# Patient Record
Sex: Male | Born: 1960 | Race: White | Hispanic: No | Marital: Single | Smoking: Former smoker
Health system: Southern US, Community
[De-identification: ages and names within clinical notes are randomized; demographics above are authoritative.]

## PROBLEM LIST (undated history)

## (undated) DIAGNOSIS — F411 Generalized anxiety disorder: Secondary | ICD-10-CM

## (undated) DIAGNOSIS — E785 Hyperlipidemia, unspecified: Secondary | ICD-10-CM

## (undated) DIAGNOSIS — L820 Inflamed seborrheic keratosis: Secondary | ICD-10-CM

## (undated) DIAGNOSIS — K219 Gastro-esophageal reflux disease without esophagitis: Secondary | ICD-10-CM

## (undated) DIAGNOSIS — F329 Major depressive disorder, single episode, unspecified: Secondary | ICD-10-CM

## (undated) DIAGNOSIS — Z86010 Personal history of colonic polyps: Secondary | ICD-10-CM

## (undated) HISTORY — DX: Major depressive disorder, single episode, unspecified: F32.9

## (undated) HISTORY — PX: COCCYX REMOVAL: SHX600

## (undated) HISTORY — PX: COLONOSCOPY: SHX174

## (undated) HISTORY — DX: Gastro-esophageal reflux disease without esophagitis: K21.9

## (undated) HISTORY — PX: POLYPECTOMY: SHX149

## (undated) HISTORY — DX: Personal history of colonic polyps: Z86.010

## (undated) HISTORY — DX: Generalized anxiety disorder: F41.1

## (undated) HISTORY — DX: Inflamed seborrheic keratosis: L82.0

## (undated) HISTORY — DX: Hyperlipidemia, unspecified: E78.5

---

## 2005-06-08 ENCOUNTER — Ambulatory Visit: Payer: Self-pay | Admitting: Internal Medicine

## 2005-06-26 ENCOUNTER — Ambulatory Visit: Payer: Self-pay | Admitting: Internal Medicine

## 2006-08-23 ENCOUNTER — Ambulatory Visit: Payer: Self-pay | Admitting: Internal Medicine

## 2006-08-30 ENCOUNTER — Ambulatory Visit: Payer: Self-pay | Admitting: Internal Medicine

## 2007-08-22 ENCOUNTER — Ambulatory Visit: Payer: Self-pay | Admitting: Internal Medicine

## 2007-08-22 DIAGNOSIS — F3289 Other specified depressive episodes: Secondary | ICD-10-CM

## 2007-08-22 DIAGNOSIS — F329 Major depressive disorder, single episode, unspecified: Secondary | ICD-10-CM

## 2007-08-22 HISTORY — DX: Major depressive disorder, single episode, unspecified: F32.9

## 2007-08-22 HISTORY — DX: Other specified depressive episodes: F32.89

## 2007-08-22 LAB — CONVERTED CEMR LAB
Alkaline Phosphatase: 113 units/L (ref 39–117)
BUN: 14 mg/dL (ref 6–23)
Basophils Relative: 0 % (ref 0.0–1.0)
CO2: 30 meq/L (ref 19–32)
Creatinine, Ser: 1 mg/dL (ref 0.4–1.5)
Eosinophils Relative: 1.2 % (ref 0.0–5.0)
Glucose, Bld: 101 mg/dL — ABNORMAL HIGH (ref 70–99)
HCT: 44.9 % (ref 39.0–52.0)
Hemoglobin: 15.3 g/dL (ref 13.0–17.0)
Monocytes Absolute: 1.1 10*3/uL — ABNORMAL HIGH (ref 0.2–0.7)
Monocytes Relative: 12.7 % — ABNORMAL HIGH (ref 3.0–11.0)
Neutrophils Relative %: 63.4 % (ref 43.0–77.0)
Nitrite: NEGATIVE
Potassium: 4.9 meq/L (ref 3.5–5.1)
Protein, U semiquant: NEGATIVE
RDW: 13.6 % (ref 11.5–14.6)
TSH: 1.99 microintl units/mL (ref 0.35–5.50)
Total Bilirubin: 0.9 mg/dL (ref 0.3–1.2)
Total CHOL/HDL Ratio: 3.5
Total Protein: 7.4 g/dL (ref 6.0–8.3)
Urobilinogen, UA: 0.2
VLDL: 11 mg/dL (ref 0–40)
WBC Urine, dipstick: NEGATIVE
WBC: 8.5 10*3/uL (ref 4.5–10.5)

## 2007-08-29 ENCOUNTER — Ambulatory Visit: Payer: Self-pay | Admitting: Internal Medicine

## 2007-08-29 DIAGNOSIS — F411 Generalized anxiety disorder: Secondary | ICD-10-CM

## 2007-08-29 HISTORY — DX: Generalized anxiety disorder: F41.1

## 2008-03-04 ENCOUNTER — Ambulatory Visit: Payer: Self-pay | Admitting: Internal Medicine

## 2008-03-04 DIAGNOSIS — H6500 Acute serous otitis media, unspecified ear: Secondary | ICD-10-CM | POA: Insufficient documentation

## 2008-12-31 ENCOUNTER — Ambulatory Visit: Payer: Self-pay | Admitting: Internal Medicine

## 2008-12-31 LAB — CONVERTED CEMR LAB
ALT: 25 units/L (ref 0–53)
AST: 34 units/L (ref 0–37)
Albumin: 4.8 g/dL (ref 3.5–5.2)
BUN: 11 mg/dL (ref 6–23)
Basophils Relative: 0.3 % (ref 0.0–3.0)
Blood in Urine, dipstick: NEGATIVE
CO2: 32 meq/L (ref 19–32)
Chloride: 103 meq/L (ref 96–112)
Creatinine, Ser: 1 mg/dL (ref 0.4–1.5)
Direct LDL: 133.3 mg/dL
Eosinophils Relative: 1.7 % (ref 0.0–5.0)
HDL: 63.6 mg/dL (ref >39.0)
Lymphocytes Relative: 26.2 % (ref 12.0–46.0)
Neutrophils Relative %: 58.8 % (ref 43.0–77.0)
Nitrite: NEGATIVE
Protein, U semiquant: NEGATIVE
RBC: 4.88 M/uL (ref 4.22–5.81)
Specific Gravity, Urine: 1.01
Total Bilirubin: 0.9 mg/dL (ref 0.3–1.2)
VLDL: 15 mg/dL (ref 0–40)
WBC Urine, dipstick: NEGATIVE
WBC: 7.4 10*3/uL (ref 4.5–10.5)

## 2009-01-07 ENCOUNTER — Ambulatory Visit: Payer: Self-pay | Admitting: Internal Medicine

## 2009-08-19 ENCOUNTER — Ambulatory Visit: Payer: Self-pay | Admitting: Gastroenterology

## 2009-08-29 ENCOUNTER — Encounter: Payer: Self-pay | Admitting: Gastroenterology

## 2009-08-29 ENCOUNTER — Ambulatory Visit: Payer: Self-pay | Admitting: Gastroenterology

## 2009-08-29 LAB — HM COLONOSCOPY

## 2009-08-31 ENCOUNTER — Encounter: Payer: Self-pay | Admitting: Gastroenterology

## 2010-03-17 ENCOUNTER — Ambulatory Visit: Payer: Self-pay | Admitting: Internal Medicine

## 2010-03-17 LAB — CONVERTED CEMR LAB
AST: 25 units/L (ref 0–37)
Albumin: 4.6 g/dL (ref 3.5–5.2)
Alkaline Phosphatase: 85 units/L (ref 39–117)
Basophils Relative: 0.6 % (ref 0.0–3.0)
Bilirubin, Direct: 0 mg/dL (ref 0.0–0.3)
CO2: 32 meq/L (ref 19–32)
Calcium: 9.8 mg/dL (ref 8.4–10.5)
Eosinophils Relative: 3.1 % (ref 0.0–5.0)
GFR calc non Af Amer: 74.97 mL/min (ref >60)
Hemoglobin: 15.2 g/dL (ref 13.0–17.0)
Ketones, urine, test strip: NEGATIVE
Lymphocytes Relative: 28.6 % (ref 12.0–46.0)
MCHC: 34.2 g/dL (ref 30.0–36.0)
Monocytes Relative: 15.1 % — ABNORMAL HIGH (ref 3.0–12.0)
Neutro Abs: 4.2 10*3/uL (ref 1.4–7.7)
Neutrophils Relative %: 52.6 % (ref 43.0–77.0)
Nitrite: NEGATIVE
Protein, U semiquant: NEGATIVE
RBC: 4.64 M/uL (ref 4.22–5.81)
Sodium: 143 meq/L (ref 135–145)
Total CHOL/HDL Ratio: 3
Total Protein: 7.6 g/dL (ref 6.0–8.3)
Triglycerides: 81 mg/dL (ref 0.0–149.0)
Urobilinogen, UA: 0.2
VLDL: 16.2 mg/dL (ref 0.0–40.0)
WBC: 8 10*3/uL (ref 4.5–10.5)

## 2010-03-24 ENCOUNTER — Ambulatory Visit: Payer: Self-pay | Admitting: Internal Medicine

## 2010-03-24 DIAGNOSIS — Z860100 Personal history of colon polyps, unspecified: Secondary | ICD-10-CM

## 2010-03-24 HISTORY — DX: Personal history of colon polyps, unspecified: Z86.0100

## 2010-03-24 HISTORY — DX: Personal history of colonic polyps: Z86.010

## 2010-04-11 ENCOUNTER — Ambulatory Visit: Payer: Self-pay | Admitting: Internal Medicine

## 2010-04-11 DIAGNOSIS — L820 Inflamed seborrheic keratosis: Secondary | ICD-10-CM

## 2010-04-11 HISTORY — DX: Inflamed seborrheic keratosis: L82.0

## 2010-10-19 ENCOUNTER — Telehealth: Payer: Self-pay | Admitting: Internal Medicine

## 2010-12-19 NOTE — Assessment & Plan Note (Signed)
Summary: ? mole//ccm   Vital Signs:  Patient profile:   50 year old male Weight:      160 pounds Temp:     97.7 degrees F oral BP sitting:   120 / 70  (right arm) Cuff size:   regular  Vitals Entered By: Duard Brady LPN (Apr 11, 2010 8:00 AM)  Procedure Note  Wart Removal: Date of onset: 04/04/2010 Onset of lesion: 1 week Indication: changing lesion Consent signed: no  Procedure # 1: cryotherapy    Region: medial    Location: arm-lower-left    # lesions removed: 1    Technique: cryotherapy  CC: c/o (L) forearm mole change in appearance Is Patient Diabetic? No   CC:  c/o (L) forearm mole change in appearance.  History of Present Illness: 50 year old patient who has had a lesion involving the left forearm  for  years or decades.  For the past week.  This slightly raised plaque has become more erythematous and scaling.  He has multiple benign-appearing nevi.  He has no other similar lesions  Allergies: 1)  ! Paxil  Physical Exam  General:  Well-developed,well-nourished,in no acute distress; alert,appropriate and cooperative throughout examination Skin:  10 mm x 12 mm slightly raised, erythematous, scaly plaque involving the left forearm area.  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 1 two times a day as needed  Patient Instructions: 1)  call if there is  significant redness, pain, or drainage

## 2010-12-19 NOTE — Assessment & Plan Note (Signed)
Summary: CPX/CJR   Vital Signs:  Patient profile:   50 year old male Height:      69 inches Weight:      157 pounds BMI:     23.27 Temp:     98.6 degrees F oral BP sitting:   110 / 70  (left arm) Cuff size:   regular  Vitals Entered By: Duard Brady LPN (Mar 24, 4741 2:40 PM) CC: cpx -doing well Is Patient Diabetic? No   CC:  cpx -doing well.  History of Present Illness: a 50 year old patient who has a history of mild anxiety disorder is seen today for a wellness exam.  He did have a colonoscopy last year.  He is doing  well today without concerns or complaints  Preventive Screening-Counseling & Management  Alcohol-Tobacco     Smoking Status: never  Allergies: 1)  ! Paxil  Past History:  Past Medical History: Depression Anxiety Colonic polyps, hx of  Past Surgical History: Coccyx Removed 1982 Sebacious cyst Removed colonoscopy 2010  Family History: Reviewed history from 01/07/2009 and no changes required. Family Hsitory Headaches Family History Kidney disease Family History Thyroid disease Family History of Neurological disorder father age 52, history of hypertension, kidney stones mother, age 43, status post thyroidectomy.  History peripheral neuropathy one brother, hypertension, kidney stones,and colonic polyps  Social History: Reviewed history from 08/29/2007 and no changes required. Occupation:  Landscape architect Single Former Smoker Alcohol use-no Drug use-no Regular exercise-no Smoking Status:  never  Review of Systems  The patient denies anorexia, fever, weight loss, weight gain, vision loss, decreased hearing, hoarseness, chest pain, syncope, dyspnea on exertion, peripheral edema, prolonged cough, headaches, hemoptysis, abdominal pain, melena, hematochezia, severe indigestion/heartburn, hematuria, incontinence, genital sores, muscle weakness, suspicious skin lesions, transient blindness, difficulty walking, depression, unusual weight change,  abnormal bleeding, enlarged lymph nodes, angioedema, breast masses, and testicular masses.    Physical Exam  General:  Well-developed,well-nourished,in no acute distress; alert,appropriate and cooperative throughout examination Head:  Normocephalic and atraumatic without obvious abnormalities. No apparent alopecia or balding. Eyes:  No corneal or conjunctival inflammation noted. EOMI. Perrla. Funduscopic exam benign, without hemorrhages, exudates or papilledema. Vision grossly normal. Ears:  External ear exam shows no significant lesions or deformities.  Otoscopic examination reveals clear canals, tympanic membranes are intact bilaterally without bulging, retraction, inflammation or discharge. Hearing is grossly normal bilaterally. Nose:  External nasal examination shows no deformity or inflammation. Nasal mucosa are pink and moist without lesions or exudates. Mouth:  Oral mucosa and oropharynx without lesions or exudates.  Teeth in good repair. Neck:  No deformities, masses, or tenderness noted. Chest Wall:  No deformities, masses, tenderness or gynecomastia noted. Breasts:  No masses or gynecomastia noted Lungs:  Normal respiratory effort, chest expands symmetrically. Lungs are clear to auscultation, no crackles or wheezes. Heart:  Normal rate and regular rhythm. S1 and S2 normal without gallop, murmur, click, rub or other extra sounds. Abdomen:  Bowel sounds positive,abdomen soft and non-tender without masses, organomegaly or hernias noted. Rectal:  No external abnormalities noted. Normal sphincter tone. No rectal masses or tenderness. Genitalia:  Testes bilaterally descended without nodularity, tenderness or masses. No scrotal masses or lesions. No penis lesions or urethral discharge. Prostate:  Prostate gland firm and smooth, no enlargement, nodularity, tenderness, mass, asymmetry or induration. Msk:  No deformity or scoliosis noted of thoracic or lumbar spine.   Pulses:  R and L  carotid,radial,femoral,dorsalis pedis and posterior tibial pulses are full and equal bilaterally Extremities:  No  clubbing, cyanosis, edema, or deformity noted with normal full range of motion of all joints.   Neurologic:  No cranial nerve deficits noted. Station and gait are normal. Plantar reflexes are down-going bilaterally. DTRs are symmetrical throughout. Sensory, motor and coordinative functions appear intact. Skin:  Intact without suspicious lesions or rashes Cervical Nodes:  No lymphadenopathy noted Axillary Nodes:  No palpable lymphadenopathy Inguinal Nodes:  No significant adenopathy Psych:  Cognition and judgment appear intact. Alert and cooperative with normal attention span and concentration. No apparent delusions, illusions, hallucinations   Impression & Recommendations:  Problem # 1:  HEALTH MAINTENANCE EXAM (ICD-V70.0)  Complete Medication List: 1)  Adult Aspirin Low Strength 81 Mg Tbdp (Aspirin) 2)  Alprazolam 0.5 Mg Tabs (Alprazolam) .Marland Kitchen.. 1 two times a day as needed  Patient Instructions: 1)  Please schedule a follow-up appointment in 1 year. 2)  It is important that you exercise regularly at least 20 minutes 5 times a week. If you develop chest pain, have severe difficulty breathing, or feel very tired , stop exercising immediately and seek medical attention. Prescriptions: ALPRAZOLAM 0.5 MG  TABS (ALPRAZOLAM) 1 two times a day as needed  #60 x 4   Entered and Authorized by:   Gordy Savers  MD   Signed by:   Gordy Savers  MD on 03/24/2010   Method used:   Print then Give to Patient   RxID:   [id number]

## 2010-12-19 NOTE — Progress Notes (Signed)
Summary: refill alprazolam  Phone Note Refill Request Message from:  Fax from Pharmacy on October 19, 2010 10:36 AM  Refills Requested: Medication #1:  ALPRAZOLAM 0.5 MG  TABS 1 two times a day as needed. [facility] [address] , [place]   Method Requested: Fax to [employer] Initial call taken by: Duard Brady LPN,  October 19, 2010 10:37 AM    Prescriptions: ALPRAZOLAM 0.5 MG  TABS (ALPRAZOLAM) 1 two times a day as needed  #60 x 4   Entered by:   Duard Brady LPN   Authorized by:   Gordy Savers  MD   Signed by:   Duard Brady LPN on 54/07/8118   Method used:   Historical   RxID:   [id number]  faxed back to [facility]. KIK

## 2011-04-06 NOTE — Assessment & Plan Note (Signed)
[facility] OFFICE NOTE   NAME:Derrick Villegas, Derrick Villegas                       MRN:          [id number]  DATE:08/30/2006                            DOB:          Apr 19, 1961    This is a 50 year old gentleman seen today for a wellness exam.  He has a  history of mild anxiety disorder.  He does take alprazolam p.r.n.  He has  had some minor dermatologic treatments and also apparently in 1982 had his  coccyx surgically removed.  He is doing well today except for some stress-  related issues.  He does have some occasional low back pain and insomnia.  He also has noticed some discoloration and deformity of his right second  toe.  This also has caused some discomfort.   FAMILY HISTORY:  Reviewed and unchanged.  Positive for kidney stone disease.  Mother has had a thyroidectomy and has a history of a peripheral neuropathy.   PHYSICAL EXAMINATION:  GENERAL:  Revealed a healthy-appearing male.  VITAL SIGNS:  Blood pressure 120/78.  SKIN:  Examination of skin revealed his right second toe to be distorted and  deviated medially.  At the base of the toe was a subungual area of  discoloration that has been there for years.  HEENT:  Revealed normal pupillary responses.  Conjunctivae clear.  ENT  normal.  NECK:  No adenopathy or bruits.  CHEST:  Clear.  CARDIOVASCULAR:  Normal heart sounds.  No murmurs.  ABDOMEN:  Benign.  EXTERNAL GENITALIA:  Normal.  RECTAL:  Normal prostate.  Heme-negative stool.  EXTREMITIES:  Negative.   IMPRESSION:  1. Normal ___________ clinical exam.  2. Anxiety disorder.  3. Probable toe onychomycosis, rule out subungual melanoma.   DISPOSITION:  Will set her for a dermatologic consult.  Laboratory studies  were reviewed.  These were unremarkable.  Reassess here in one or two years.            ______________________________  Gordy Savers, MD    PFK/MedQ  DD:  08/30/2006  DT:   09/01/2006  Job #:  [id number]

## 2011-05-11 ENCOUNTER — Other Ambulatory Visit (INDEPENDENT_AMBULATORY_CARE_PROVIDER_SITE_OTHER): Payer: BC Managed Care – PPO

## 2011-05-11 DIAGNOSIS — Z0000 Encounter for general adult medical examination without abnormal findings: Secondary | ICD-10-CM

## 2011-05-11 LAB — HEPATIC FUNCTION PANEL
Albumin: 4.5 g/dL (ref 3.5–5.2)
Alkaline Phosphatase: 100 U/L (ref 39–117)
Bilirubin, Direct: 0.1 mg/dL (ref 0.0–0.3)
Total Protein: 7 g/dL (ref 6.0–8.3)

## 2011-05-11 LAB — POCT URINALYSIS DIPSTICK
Ketones, UA: NEGATIVE
Protein, UA: NEGATIVE
Spec Grav, UA: 1.015
Urobilinogen, UA: 0.2
pH, UA: 7

## 2011-05-11 LAB — BASIC METABOLIC PANEL
CO2: 29 mEq/L (ref 19–32)
Calcium: 9.6 mg/dL (ref 8.4–10.5)
Chloride: 103 mEq/L (ref 96–112)
Creatinine, Ser: 0.9 mg/dL (ref 0.4–1.5)
Glucose, Bld: 96 mg/dL (ref 70–99)

## 2011-05-11 LAB — CBC WITH DIFFERENTIAL/PLATELET
Basophils Absolute: 0.1 10*3/uL (ref 0.0–0.1)
Basophils Relative: 0.6 % (ref 0.0–3.0)
Eosinophils Absolute: 0.3 10*3/uL (ref 0.0–0.7)
Hemoglobin: 14.6 g/dL (ref 13.0–17.0)
Lymphocytes Relative: 25.3 % (ref 12.0–46.0)
MCHC: 33.8 g/dL (ref 30.0–36.0)
MCV: 96.3 fl (ref 78.0–100.0)
Monocytes Absolute: 1.4 10*3/uL — ABNORMAL HIGH (ref 0.1–1.0)
Neutro Abs: 5.8 10*3/uL (ref 1.4–7.7)
Neutrophils Relative %: 57.3 % (ref 43.0–77.0)
RBC: 4.5 Mil/uL (ref 4.22–5.81)
RDW: 14.7 % — ABNORMAL HIGH (ref 11.5–14.6)

## 2011-05-11 LAB — LIPID PANEL
HDL: 58.2 mg/dL (ref >39.00)
Triglycerides: 77 mg/dL (ref 0.0–149.0)
VLDL: 15.4 mg/dL (ref 0.0–40.0)

## 2011-05-17 ENCOUNTER — Encounter: Payer: Self-pay | Admitting: Internal Medicine

## 2011-05-18 ENCOUNTER — Encounter: Payer: Self-pay | Admitting: Internal Medicine

## 2011-05-18 ENCOUNTER — Ambulatory Visit (INDEPENDENT_AMBULATORY_CARE_PROVIDER_SITE_OTHER): Payer: BC Managed Care – PPO | Admitting: Internal Medicine

## 2011-05-18 VITALS — BP 110/70 | HR 90 | Temp 98.5°F | Resp 18 | Ht 69.75 in | Wt 156.0 lb

## 2011-05-18 DIAGNOSIS — Z23 Encounter for immunization: Secondary | ICD-10-CM

## 2011-05-18 DIAGNOSIS — Z0000 Encounter for general adult medical examination without abnormal findings: Secondary | ICD-10-CM

## 2011-05-18 MED ORDER — ALPRAZOLAM 0.5 MG PO TABS: 0.5000 mg | ORAL_TABLET | Freq: Two times a day (BID) | ORAL | Status: DC | PRN

## 2011-05-18 NOTE — Patient Instructions (Signed)
It is important that you exercise regularly, at least 20 minutes 3 to 4 times per week.  If you develop chest pain or shortness of breath seek  medical attention.  Call or return to clinic  as needed  Return in one year for follow-up

## 2011-05-18 NOTE — Progress Notes (Signed)
  Subjective:    Patient ID: Derrick Villegas, male    DOB: 1960-12-01, 50 y.o.   MRN: [id number]  HPI  50 year old patient who is seen today for a wellness exam. He enjoys excellent health he has history of mild anxiety disorder and does take alprazolam usually one half tablet to assist with sleep. He does not take this through the day. He did have a colonoscopy 2 years ago at age 50 and colonic polyps were diagnosed. He is scheduled for followup in 3 years. He has no concerns or complaints today laboratory studies were reviewed.   Review of Systems  Constitutional: Negative for fever, chills, activity change, appetite change and fatigue.  HENT: Negative for hearing loss, ear pain, congestion, rhinorrhea, sneezing, mouth sores, trouble swallowing, neck pain, neck stiffness, dental problem, voice change, sinus pressure and tinnitus.   Eyes: Negative for photophobia, pain, redness and visual disturbance.  Respiratory: Negative for apnea, cough, choking, chest tightness, shortness of breath and wheezing.   Cardiovascular: Negative for chest pain, palpitations and leg swelling.  Gastrointestinal: Negative for nausea, vomiting, abdominal pain, diarrhea, constipation, blood in stool, abdominal distention, anal bleeding and rectal pain.  Genitourinary: Negative for dysuria, urgency, frequency, hematuria, flank pain, decreased urine volume, discharge, penile swelling, scrotal swelling, difficulty urinating, genital sores and testicular pain.  Musculoskeletal: Negative for myalgias, back pain, joint swelling, arthralgias and gait problem.  Skin: Negative for color change, rash and wound.  Neurological: Negative for dizziness, tremors, seizures, syncope, facial asymmetry, speech difficulty, weakness, light-headedness, numbness and headaches.  Hematological: Negative for adenopathy. Does not bruise/bleed easily.  Psychiatric/Behavioral: Positive for sleep disturbance. Negative for suicidal ideas, hallucinations,  behavioral problems, confusion, self-injury, dysphoric mood, decreased concentration and agitation. The patient is not nervous/anxious.        Objective:   Physical Exam  Constitutional: He is oriented to person, place, and time. He appears well-developed.  HENT:  Head: Normocephalic.  Right Ear: External ear normal.  Left Ear: External ear normal.  Eyes: Conjunctivae and EOM are normal.  Neck: Normal range of motion.  Cardiovascular: Normal rate and normal heart sounds.   Pulmonary/Chest: Breath sounds normal.  Abdominal: Bowel sounds are normal.  Genitourinary: Rectum normal, prostate normal and penis normal. Guaiac negative stool. No penile tenderness.  Musculoskeletal: Normal range of motion. He exhibits no edema and no tenderness.  Neurological: He is alert and oriented to person, place, and time.  Psychiatric: He has a normal mood and affect. His behavior is normal.          Assessment & Plan:   Annual health examination Mild anxiety disorder with insomnia  More regular exercise regimen discussed and encouraged. Medications refilled;  return here in one year or when necessary

## 2011-12-10 ENCOUNTER — Other Ambulatory Visit: Payer: Self-pay | Admitting: Internal Medicine

## 2012-03-04 ENCOUNTER — Other Ambulatory Visit: Payer: Self-pay | Admitting: Internal Medicine

## 2012-05-02 ENCOUNTER — Telehealth: Payer: Self-pay | Admitting: Internal Medicine

## 2012-05-02 NOTE — Telephone Encounter (Signed)
This will be discussed and ordered at cpx

## 2012-05-02 NOTE — Telephone Encounter (Signed)
Pt is coming in for a cpx on 05/23/12 and is requesting to be screened for Colon cancer, skin cancer and Melanoma. Please advise how to schedule this appt correctly

## 2012-05-16 ENCOUNTER — Other Ambulatory Visit (INDEPENDENT_AMBULATORY_CARE_PROVIDER_SITE_OTHER): Payer: BC Managed Care – PPO

## 2012-05-16 ENCOUNTER — Telehealth: Payer: Self-pay | Admitting: *Deleted

## 2012-05-16 DIAGNOSIS — Z0000 Encounter for general adult medical examination without abnormal findings: Secondary | ICD-10-CM

## 2012-05-16 LAB — CBC WITH DIFFERENTIAL/PLATELET
Basophils Relative: 0.7 % (ref 0.0–3.0)
Eosinophils Relative: 0.9 % (ref 0.0–5.0)
Lymphocytes Relative: 22.4 % (ref 12.0–46.0)
Neutrophils Relative %: 64.2 % (ref 43.0–77.0)
RBC: 4.68 Mil/uL (ref 4.22–5.81)
WBC: 8.8 10*3/uL (ref 4.5–10.5)

## 2012-05-16 LAB — POCT URINALYSIS DIPSTICK
Bilirubin, UA: NEGATIVE
Ketones, UA: NEGATIVE
Leukocytes, UA: NEGATIVE
Protein, UA: NEGATIVE

## 2012-05-16 LAB — HEPATIC FUNCTION PANEL
ALT: 16 U/L (ref 0–53)
AST: 24 U/L (ref 0–37)
Alkaline Phosphatase: 80 U/L (ref 39–117)
Bilirubin, Direct: 0.1 mg/dL (ref 0.0–0.3)
Total Protein: 7.2 g/dL (ref 6.0–8.3)

## 2012-05-16 LAB — PSA: PSA: 0.87 ng/mL (ref 0.10–4.00)

## 2012-05-16 LAB — BASIC METABOLIC PANEL
Calcium: 9.6 mg/dL (ref 8.4–10.5)
Chloride: 103 mEq/L (ref 96–112)
Creatinine, Ser: 1.1 mg/dL (ref 0.4–1.5)
Sodium: 139 mEq/L (ref 135–145)

## 2012-05-16 NOTE — Telephone Encounter (Signed)
Pt is in the lab getting CPX labs ordered and is requesting Colon CA and Skin CA screening labs be ordered.  I spoke with Dr Kirtland Bouchard and he states there is no such labs.  Jamie in lab informed

## 2012-05-21 ENCOUNTER — Encounter: Payer: Self-pay | Admitting: Internal Medicine

## 2012-05-23 ENCOUNTER — Ambulatory Visit (INDEPENDENT_AMBULATORY_CARE_PROVIDER_SITE_OTHER): Payer: BC Managed Care – PPO | Admitting: Internal Medicine

## 2012-05-23 ENCOUNTER — Encounter: Payer: Self-pay | Admitting: Internal Medicine

## 2012-05-23 VITALS — BP 130/82 | HR 82 | Temp 98.0°F | Resp 16 | Ht 70.0 in | Wt 157.0 lb

## 2012-05-23 DIAGNOSIS — Z86010 Personal history of colonic polyps: Secondary | ICD-10-CM

## 2012-05-23 DIAGNOSIS — F411 Generalized anxiety disorder: Secondary | ICD-10-CM

## 2012-05-23 MED ORDER — ALPRAZOLAM 0.5 MG PO TABS: 0.5000 mg | ORAL_TABLET | Freq: Three times a day (TID) | ORAL | Status: DC | PRN

## 2012-05-23 NOTE — Patient Instructions (Signed)
It is important that you exercise regularly, at least 20 minutes 3 to 4 times per week.  If you develop chest pain or shortness of breath seek  medical attention.  Return in one year for follow-up   

## 2012-05-23 NOTE — Progress Notes (Signed)
Subjective:    Patient ID: Derrick Villegas, male    DOB: 1961-06-04, 51 y.o.   MRN: [id number]  HPI  51 year old patient who is seen today for a health maintenance examination. He has a history of anxiety disorder as well as colonic polyps. He had his initial colonoscopy at age 61 and did have colonic polyps diagnosed at that time. He states her brother was recently diagnosed with obstructing colon cancer following a colonoscopy 3 years prior. His mother has been diagnosed and treated for significant postherpetic neuralgia and he is asking about a shingles vaccine.  Past Medical History  Diagnosis Date  . ANXIETY 08/29/2007  . COLONIC POLYPS, HX OF 03/24/2010  . DEPRESSION 08/22/2007  . Inflamed seborrheic keratosis 04/11/2010    History   Social History  . Marital Status: Single    Spouse Name: N/A    Number of Children: N/A  . Years of Education: N/A   Occupational History  . Not on file.   Social History Main Topics  . Smoking status: Former Games developer  . Smokeless tobacco: Never Used  . Alcohol Use: No  . Drug Use: No  . Sexually Active: Not on file   Other Topics Concern  . Not on file   Social History Narrative  . No narrative on file    Past Surgical History  Procedure Date  . Coccyx removal     Family History  Problem Relation Age of Onset  . Thyroid disease Mother   . Hypertension Father   . Kidney disease Father   . Hypertension Brother   . Kidney disease Brother     Allergies  Allergen Reactions  . Paroxetine     REACTION: nausea, tingling in arms    Current Outpatient Prescriptions on File Prior to Visit  Medication Sig Dispense Refill  . ALPRAZolam (XANAX) 0.5 MG tablet take 1 tablet by mouth twice a day if needed  60 tablet  1    BP 130/82  Pulse 82  Temp 98 F (36.7 C) (Oral)  Resp 16  Ht 5\' 10"  (1.778 m)  Wt 157 lb (71.215 kg)  BMI 22.53 kg/m2  SpO2 98%       Review of Systems  Constitutional: Negative for fever, chills, activity  change, appetite change and fatigue.  HENT: Negative for hearing loss, ear pain, congestion, rhinorrhea, sneezing, mouth sores, trouble swallowing, neck pain, neck stiffness, dental problem, voice change, sinus pressure and tinnitus.   Eyes: Negative for photophobia, pain, redness and visual disturbance.  Respiratory: Negative for apnea, cough, choking, chest tightness, shortness of breath and wheezing.   Cardiovascular: Negative for chest pain, palpitations and leg swelling.  Gastrointestinal: Negative for nausea, vomiting, abdominal pain, diarrhea, constipation, blood in stool, abdominal distention, anal bleeding and rectal pain.  Genitourinary: Negative for dysuria, urgency, frequency, hematuria, flank pain, decreased urine volume, discharge, penile swelling, scrotal swelling, difficulty urinating, genital sores and testicular pain.  Musculoskeletal: Negative for myalgias, back pain, joint swelling, arthralgias and gait problem.  Skin: Negative for color change, rash and wound.  Neurological: Negative for dizziness, tremors, seizures, syncope, facial asymmetry, speech difficulty, weakness, light-headedness, numbness and headaches.  Hematological: Negative for adenopathy. Does not bruise/bleed easily.  Psychiatric/Behavioral: Negative for suicidal ideas, hallucinations, behavioral problems, confusion, disturbed wake/sleep cycle, self-injury, dysphoric mood, decreased concentration and agitation. The patient is not nervous/anxious.        Objective:   Physical Exam  Constitutional: He appears well-developed and well-nourished.  HENT:  Head: Normocephalic and  atraumatic.  Right Ear: External ear normal.  Left Ear: External ear normal.  Nose: Nose normal.  Mouth/Throat: Oropharynx is clear and moist.  Eyes: Conjunctivae and EOM are normal. Pupils are equal, round, and reactive to light. No scleral icterus.  Neck: Normal range of motion. Neck supple. No JVD present. No thyromegaly present.    Cardiovascular: Regular rhythm, normal heart sounds and intact distal pulses.  Exam reveals no gallop and no friction rub.   No murmur heard. Pulmonary/Chest: Effort normal and breath sounds normal. He exhibits no tenderness.  Abdominal: Soft. Bowel sounds are normal. He exhibits no distension and no mass. There is no tenderness.  Genitourinary: Prostate normal and penis normal. Guaiac negative stool.  Musculoskeletal: Normal range of motion. He exhibits no edema and no tenderness.  Lymphadenopathy:    He has no cervical adenopathy.  Neurological: He is alert. He has normal reflexes. No cranial nerve deficit. Coordination normal.  Skin: Skin is warm and dry. No rash noted.  Psychiatric: He has a normal mood and affect. His behavior is normal.          Assessment & Plan:   Preventive health examination History of colonic polyps Anxiety disorder Family history: Cancer.  We'll give the patient slides to check for occult blood x4. We'll do this annually until his schedule colonoscopy in 2015

## 2012-05-27 ENCOUNTER — Encounter: Payer: Self-pay | Admitting: Internal Medicine

## 2012-12-08 ENCOUNTER — Other Ambulatory Visit: Payer: Self-pay | Admitting: Internal Medicine

## 2013-02-25 ENCOUNTER — Other Ambulatory Visit: Payer: Self-pay | Admitting: Internal Medicine

## 2013-06-12 ENCOUNTER — Other Ambulatory Visit (INDEPENDENT_AMBULATORY_CARE_PROVIDER_SITE_OTHER): Payer: BC Managed Care – PPO

## 2013-06-12 DIAGNOSIS — Z0000 Encounter for general adult medical examination without abnormal findings: Secondary | ICD-10-CM

## 2013-06-12 DIAGNOSIS — R7989 Other specified abnormal findings of blood chemistry: Secondary | ICD-10-CM

## 2013-06-12 LAB — BASIC METABOLIC PANEL
BUN: 12 mg/dL (ref 6–23)
Calcium: 9.7 mg/dL (ref 8.4–10.5)
Creatinine, Ser: 1 mg/dL (ref 0.4–1.5)
GFR: 82.65 mL/min (ref >60.00)

## 2013-06-12 LAB — POCT URINALYSIS DIPSTICK
Bilirubin, UA: NEGATIVE
Glucose, UA: NEGATIVE
Nitrite, UA: NEGATIVE

## 2013-06-12 LAB — HEPATIC FUNCTION PANEL
ALT: 21 U/L (ref 0–53)
AST: 24 U/L (ref 0–37)
Bilirubin, Direct: 0.1 mg/dL (ref 0.0–0.3)
Total Bilirubin: 0.5 mg/dL (ref 0.3–1.2)

## 2013-06-12 LAB — CBC WITH DIFFERENTIAL/PLATELET
Eosinophils Relative: 4.6 % (ref 0.0–5.0)
Monocytes Absolute: 1.2 10*3/uL — ABNORMAL HIGH (ref 0.1–1.0)
Monocytes Relative: 12.7 % — ABNORMAL HIGH (ref 3.0–12.0)
Neutrophils Relative %: 51.9 % (ref 43.0–77.0)
Platelets: 297 10*3/uL (ref 150.0–400.0)
WBC: 9.2 10*3/uL (ref 4.5–10.5)

## 2013-06-12 LAB — LDL CHOLESTEROL, DIRECT: Direct LDL: 126.8 mg/dL

## 2013-06-12 LAB — LIPID PANEL
HDL: 54.1 mg/dL (ref >39.00)
Triglycerides: 154 mg/dL — ABNORMAL HIGH (ref 0.0–149.0)

## 2013-06-12 LAB — PSA: PSA: 1.01 ng/mL (ref 0.10–4.00)

## 2013-06-19 ENCOUNTER — Encounter: Payer: Self-pay | Admitting: Internal Medicine

## 2013-06-19 ENCOUNTER — Ambulatory Visit (INDEPENDENT_AMBULATORY_CARE_PROVIDER_SITE_OTHER): Payer: BC Managed Care – PPO | Admitting: Internal Medicine

## 2013-06-19 VITALS — BP 140/90 | HR 76 | Temp 98.7°F | Resp 18 | Ht 69.5 in | Wt 162.0 lb

## 2013-06-19 DIAGNOSIS — Z0000 Encounter for general adult medical examination without abnormal findings: Secondary | ICD-10-CM

## 2013-06-19 DIAGNOSIS — Z86010 Personal history of colonic polyps: Secondary | ICD-10-CM

## 2013-06-19 DIAGNOSIS — Z800 Family history of malignant neoplasm of digestive organs: Secondary | ICD-10-CM

## 2013-06-19 MED ORDER — ALPRAZOLAM 0.5 MG PO TABS: ORAL_TABLET | ORAL | Status: DC

## 2013-06-19 NOTE — Progress Notes (Signed)
Patient ID: NIL Derrick Villegas, male   DOB: 1961-04-05, 52 y.o.   MRN: [id number]  Subjective:    Patient ID: Derrick Villegas, male    DOB: 1961/03/02, 52 y.o.   MRN: [id number]  HPI  89 -year-old patient who is seen today for a health maintenance examination. He has a history of anxiety disorder as well as colonic polyps. He had his initial colonoscopy at age 62 and did have colonic polyps diagnosed at that time. He has a brother who was diagnosed with colon cancer in the spring of 2013. He is due for followup colonoscopy next year   His father has died recently due 2 pancreatic cancer at age 22. His mother has been diagnosed and treated for significant postherpetic neuralgia and he is asking about a shingles vaccine.  Past Medical History  Diagnosis Date  . ANXIETY 08/29/2007  . COLONIC POLYPS, HX OF 03/24/2010  . DEPRESSION 08/22/2007  . Inflamed seborrheic keratosis 04/11/2010    History   Social History  . Marital Status: Single    Spouse Name: N/A    Number of Children: N/A  . Years of Education: N/A   Occupational History  . Not on file.   Social History Main Topics  . Smoking status: Former Games developer  . Smokeless tobacco: Never Used  . Alcohol Use: No  . Drug Use: No  . Sexually Active: Not on file   Other Topics Concern  . Not on file   Social History Narrative  . No narrative on file    Past Surgical History  Procedure Laterality Date  . Coccyx removal      Family History  Problem Relation Age of Onset  . Thyroid disease Mother   . Hypertension Father   . Kidney disease Father   . Hypertension Brother   . Kidney disease Brother   . Cancer Brother     Colon cancer    Allergies  Allergen Reactions  . Paroxetine     REACTION: nausea, tingling in arms    No current outpatient prescriptions on file prior to visit.   No current facility-administered medications on file prior to visit.    BP 140/90  Pulse 76  Temp(Src) 98.7 F (37.1 C) (Oral)  Resp 18  Ht  5' 9.5" (1.765 m)  Wt 162 lb (73.483 kg)  BMI 23.59 kg/m2  SpO2 98%       Review of Systems  Constitutional: Negative for fever, chills, activity change, appetite change and fatigue.  HENT: Negative for hearing loss, ear pain, congestion, rhinorrhea, sneezing, mouth sores, trouble swallowing, neck pain, neck stiffness, dental problem, voice change, sinus pressure and tinnitus.   Eyes: Negative for photophobia, pain, redness and visual disturbance.  Respiratory: Negative for apnea, cough, choking, chest tightness, shortness of breath and wheezing.   Cardiovascular: Negative for chest pain, palpitations and leg swelling.  Gastrointestinal: Negative for nausea, vomiting, abdominal pain, diarrhea, constipation, blood in stool, abdominal distention, anal bleeding and rectal pain.  Genitourinary: Negative for dysuria, urgency, frequency, hematuria, flank pain, decreased urine volume, discharge, penile swelling, scrotal swelling, difficulty urinating, genital sores and testicular pain.  Musculoskeletal: Negative for myalgias, back pain, joint swelling, arthralgias and gait problem.  Skin: Negative for color change, rash and wound.  Neurological: Negative for dizziness, tremors, seizures, syncope, facial asymmetry, speech difficulty, weakness, light-headedness, numbness and headaches.  Hematological: Negative for adenopathy. Does not bruise/bleed easily.  Psychiatric/Behavioral: Negative for suicidal ideas, hallucinations, behavioral problems, confusion, sleep disturbance, self-injury, dysphoric mood,  decreased concentration and agitation. The patient is not nervous/anxious.        Objective:   Physical Exam  Constitutional: He appears well-developed and well-nourished.  HENT:  Head: Normocephalic and atraumatic.  Right Ear: External ear normal.  Left Ear: External ear normal.  Nose: Nose normal.  Mouth/Throat: Oropharynx is clear and moist.  Eyes: Conjunctivae and EOM are normal. Pupils  are equal, round, and reactive to light. No scleral icterus.  Neck: Normal range of motion. Neck supple. No JVD present. No thyromegaly present.  Cardiovascular: Regular rhythm, normal heart sounds and intact distal pulses.  Exam reveals no gallop and no friction rub.   No murmur heard. Pulmonary/Chest: Effort normal and breath sounds normal. He exhibits no tenderness.  Abdominal: Soft. Bowel sounds are normal. He exhibits no distension and no mass. There is no tenderness.  Genitourinary: Prostate normal and penis normal. Guaiac negative stool.  Musculoskeletal: Normal range of motion. He exhibits no edema and no tenderness.  Lymphadenopathy:    He has no cervical adenopathy.  Neurological: He is alert. He has normal reflexes. No cranial nerve deficit. Coordination normal.  Skin: Skin is warm and dry. No rash noted.  Psychiatric: He has a normal mood and affect. His behavior is normal.          Assessment & Plan:   Preventive health examination History of colonic polyps Anxiety disorder Family history: Cancer. Followup colonoscopy one year

## 2013-06-19 NOTE — Patient Instructions (Signed)
Colonoscopy 2015    It is important that you exercise regularly, at least 20 minutes 3 to 4 times per week.  If you develop chest pain or shortness of breath seek  medical attention.  Return in one year for follow-up

## 2014-01-13 ENCOUNTER — Other Ambulatory Visit: Payer: Self-pay | Admitting: Internal Medicine

## 2014-02-27 ENCOUNTER — Encounter: Payer: Self-pay | Admitting: Internal Medicine

## 2014-02-27 ENCOUNTER — Ambulatory Visit (INDEPENDENT_AMBULATORY_CARE_PROVIDER_SITE_OTHER): Payer: BC Managed Care – PPO | Admitting: Internal Medicine

## 2014-02-27 VITALS — BP 130/80 | HR 78 | Temp 98.2°F | Resp 20 | Ht 69.5 in | Wt 156.0 lb

## 2014-02-27 DIAGNOSIS — M65849 Other synovitis and tenosynovitis, unspecified hand: Secondary | ICD-10-CM

## 2014-02-27 DIAGNOSIS — M778 Other enthesopathies, not elsewhere classified: Secondary | ICD-10-CM

## 2014-02-27 DIAGNOSIS — M65839 Other synovitis and tenosynovitis, unspecified forearm: Secondary | ICD-10-CM

## 2014-02-27 DIAGNOSIS — M779 Enthesopathy, unspecified: Principal | ICD-10-CM

## 2014-02-27 NOTE — Patient Instructions (Signed)
Please increase the advil to 600mg  (3 tabs) three times a day with meals. Call in a week or so for cortisone shot if it is not better. (sports medicine doctor)

## 2014-02-27 NOTE — Assessment & Plan Note (Signed)
Reassured---this should resolve on its own Immobilize only for comfort Ice Increase ibuprofen If not improving--call for cortisone shot

## 2014-02-27 NOTE — Progress Notes (Signed)
Pre-visit discussion using our clinic review tool. No additional management support is needed unless otherwise documented below in the visit note.  

## 2014-02-27 NOTE — Progress Notes (Signed)
   Subjective:    Patient ID: Derrick Villegas, male    DOB: 08-11-1961, 53 y.o.   MRN: [id number]  HPI Sheard Majestic to be 4 nights ago Woke the next morning with is right hand crumpled up Having hitch when flexing or extending the distal phalanx Seemed to improve after shower  Then difficulty using mouse at work Kept it in brace over the past few days Has been stable  Has been taking advil-- 400mg  bid since yesterday Hasn't tried heat or ice  Current Outpatient Prescriptions on File Prior to Visit  Medication Sig Dispense Refill  . ALPRAZolam (XANAX) 0.5 MG tablet take 1 tablet by mouth three times a day if needed for sleep  60 tablet  3  . aspirin 81 MG tablet Take 81 mg by mouth daily.      . Multiple Vitamin (MULTIVITAMIN) tablet Take 1 tablet by mouth daily.      . Omega-3 Fatty Acids (FISH OIL) 1000 MG CAPS Take 1 capsule by mouth daily.       No current facility-administered medications on file prior to visit.    Allergies  Allergen Reactions  . Paroxetine     REACTION: nausea, tingling in arms    Past Medical History  Diagnosis Date  . ANXIETY 08/29/2007  . COLONIC POLYPS, HX OF 03/24/2010  . DEPRESSION 08/22/2007  . Inflamed seborrheic keratosis 04/11/2010    Past Surgical History  Procedure Laterality Date  . Coccyx removal      Family History  Problem Relation Age of Onset  . Thyroid disease Mother   . Hypertension Father   . Kidney disease Father   . Hypertension Brother   . Kidney disease Brother   . Cancer Brother     Colon cancer    History   Social History  . Marital Status: Single    Spouse Name: N/A    Number of Children: N/A  . Years of Education: N/A   Occupational History  . Not on file.   Social History Main Topics  . Smoking status: Former Research scientist (life sciences)  . Smokeless tobacco: Never Used  . Alcohol Use: No  . Drug Use: No  . Sexual Activity: Not on file   Other Topics Concern  . Not on file   Social History Narrative  . 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 No distress.  Musculoskeletal:  No swelling or tenderness in right wrist or hand Normal ROM all in joint--- thumb DIP does pop when extending but not tender          Assessment & Plan:

## 2014-03-10 ENCOUNTER — Encounter: Payer: Self-pay | Admitting: Family Medicine

## 2014-03-10 ENCOUNTER — Ambulatory Visit (INDEPENDENT_AMBULATORY_CARE_PROVIDER_SITE_OTHER): Payer: BC Managed Care – PPO | Admitting: Family Medicine

## 2014-03-10 ENCOUNTER — Other Ambulatory Visit (INDEPENDENT_AMBULATORY_CARE_PROVIDER_SITE_OTHER): Payer: BC Managed Care – PPO

## 2014-03-10 VITALS — BP 140/90 | HR 73

## 2014-03-10 DIAGNOSIS — M79644 Pain in right finger(s): Secondary | ICD-10-CM

## 2014-03-10 DIAGNOSIS — M79609 Pain in unspecified limb: Secondary | ICD-10-CM

## 2014-03-10 DIAGNOSIS — M65311 Trigger thumb, right thumb: Secondary | ICD-10-CM | POA: Insufficient documentation

## 2014-03-10 DIAGNOSIS — M6530 Trigger finger, unspecified finger: Secondary | ICD-10-CM

## 2014-03-10 MED ORDER — MELOXICAM 15 MG PO TABS: 15.0000 mg | ORAL_TABLET | Freq: Every day | ORAL | Status: DC

## 2014-03-10 NOTE — Patient Instructions (Signed)
Good to see you Ice 10 minutes 2 times a day meloxicam daily for 10 days then as needed Wear brace for 72 hours. Then nightly for 2 weeks Exercises in 3 days Come back in 2 weeks to make sure you are better.

## 2014-03-10 NOTE — Progress Notes (Signed)
[facility] [address] [place], [place] [address] Phone: [phone] Subjective:    I'm seeing this patient by the request  of:  Nyoka Cowden, MD   CC: Right thumb pain  [id number] Derrick Villegas is a 53 y.o. male coming in with complaint of right thumb pain. Patient states that this started approximately 2 weeks ago. Patient remembers waking up and having severe pain of his right thumb that did follow after having a significant pop. Patient states since that time his thumb can get stuck in certain positions. Patient has some pain to palpation and has been strapping himself. Patient has tried some ibuprofen as well as ice he with no significant benefit. Patient denies any swelling of the joint or any numbness or tingling. Patient does use his hands for work on a regular basis. This of the pain is 8/10 in severity.     Past medical history, social, surgical and family history all reviewed in electronic medical record.   Review of Systems: No headache, visual changes, nausea, vomiting, diarrhea, constipation, dizziness, abdominal pain, skin rash, fevers, chills, night sweats, weight loss, swollen lymph nodes, body aches, joint swelling, muscle aches, chest pain, shortness of breath, mood changes.   Objective Blood pressure 140/90, pulse 73, SpO2 97.00%.  General: No apparent distress alert and oriented x3 mood and affect normal, dressed appropriately.  HEENT: Pupils equal, extraocular movements intact  Respiratory: Patient's speak in full sentences and does not appear short of breath  Cardiovascular: No lower extremity edema, non tender, no erythema  Skin: Warm dry intact with no signs of infection or rash on extremities or on axial skeleton.  Abdomen: Soft nontender  Neuro: Cranial nerves II through XII are intact, neurovascularly intact in all extremities with 2+ DTRs and 2+ pulses.  Lymph: No lymphadenopathy of posterior or anterior  cervical chain or axillae bilaterally.  Gait normal with good balance and coordination.  MSK:  Non tender with full range of motion and good stability and symmetric strength and tone of shoulders, elbows,  hip, knee and ankles bilaterally.  Wrist: Right Inspection normal with no visible erythema or swelling. ROM smooth and normal with good flexion and extension and ulnar/radial deviation that is symmetrical with opposite wrist. Palpation is normal over metacarpals, navicular, lunate, and TFCC; tendons without tenderness/ swelling No snuffbox tenderness. No tenderness over Canal of Guyon. Strength 5/5 in all directions without pain. Negative Finkelstein, tinel's and phalens. Negative Watson's test. Patient's thumb though does have triggering of the IP joint. There is a nodule that is palpated that is tender to palpation. Patient does have increasing flexibility of the thumbs bilaterally  MSK US performed of: Right wrist This study was ordered, performed, and interpreted by Charlann Boxer D.O.  Wrist: All extensor compartments visualized and tendons all normal in appearance without fraying, tears, or sheath effusions. No effusion seen. TFCC intact. Scapholunate ligament intact. Carpal tunnel visualized and median nerve area normal, flexor tendons all normal in appearance without fraying, tears, or sheath effusions. Power doppler signal normal. Patient's flexor tendon of the thumb does show that patient has a nodule over the IP joints but does have calcific changes.  IMPRESSION:  Tenosynovitis with nodule noted. Thumb trigger finger.  After verbal consent patient was prepped with alcohol swabs and with a 25-gauge medications needle was injected with 0.5% Marcaine in the amount of 1 cc and 0.5 cc of Kenalog 40 mg/dL. Into tendon sheath with ultrasound guidance.  Tolerated procedure  well.  Post injections instructions given.      Impression and Recommendations:     This case required  medical decision making of moderate complexity.

## 2014-03-10 NOTE — Assessment & Plan Note (Signed)
Injected today. Patient given a brace that he can also wear a night for the next 72 hours nightly for the next 2 weeks. Patient was given home exercises to start in 72 hours as well. We discussed icing and range of motion exercises it can be beneficial. Patient knows that it may take more than one injection to help with his trigger finger. Patient will come back again in 2 weeks for further evaluation.

## 2014-03-25 ENCOUNTER — Encounter: Payer: Self-pay | Admitting: Family Medicine

## 2014-03-25 ENCOUNTER — Ambulatory Visit (INDEPENDENT_AMBULATORY_CARE_PROVIDER_SITE_OTHER): Payer: BC Managed Care – PPO | Admitting: Family Medicine

## 2014-03-25 VITALS — BP 142/84 | HR 58

## 2014-03-25 DIAGNOSIS — M6530 Trigger finger, unspecified finger: Secondary | ICD-10-CM

## 2014-03-25 DIAGNOSIS — M65311 Trigger thumb, right thumb: Secondary | ICD-10-CM

## 2014-03-25 MED ORDER — DICLOFENAC SODIUM 2 % TD SOLN: 2.0000 "application " | Freq: Two times a day (BID) | TRANSDERMAL | Status: DC

## 2014-03-25 NOTE — Patient Instructions (Signed)
Good to see you +/- the brace at this time.  Message as much as you want.  Continue icing Try pennsaid twice daily they will send it to you.  Come back in 10 days ish and will do another injection.

## 2014-03-25 NOTE — Progress Notes (Signed)
  [facility] [place], [place] [address] Phone: [phone] Subjective:     CC: Right thumb pain follow up  Derrick Villegas is a 53 y.o. male coming in for followup of his left thumb pain. Patient was seen previously and given anti-inflammatories as well as corticosteroid injection into the trigger finger. Patient states that he did have mild improvement for about 2 days and then unfortunately the pain started to come back. Patient states it is better than he was before but still he is unable to do his job significantly effectively. Patient states it is still snapping in with a cemented does cause pain. Patient states that the pain at rest though has completely resolved. Patient has been bracing at but has not noticed any significant improvement but denies any new symptoms..     Past medical history, social, surgical and family history all reviewed in electronic medical record.   Review of Systems: No headache, visual changes, nausea, vomiting, diarrhea, constipation, dizziness, abdominal pain, skin rash, fevers, chills, night sweats, weight loss, swollen lymph nodes, body aches, joint swelling, muscle aches, chest pain, shortness of breath, mood changes.   Objective Blood pressure 142/84, pulse 58, SpO2 98.00%.  General: No apparent distress alert and oriented x3 mood and affect normal, dressed appropriately.  HEENT: Pupils equal, extraocular movements intact  Respiratory: Patient's speak in full sentences and does not appear short of breath  Cardiovascular: No lower extremity edema, non tender, no erythema  Skin: Warm dry intact with no signs of infection or rash on extremities or on axial skeleton.  Abdomen: Soft nontender  Neuro: Cranial nerves II through XII are intact, neurovascularly intact in all extremities with 2+ DTRs and 2+ pulses.  Lymph: No lymphadenopathy of posterior or anterior cervical chain or axillae bilaterally.    Gait normal with good balance and coordination.  MSK:  Non tender with full range of motion and good stability and symmetric strength and tone of shoulders, elbows,  hip, knee and ankles bilaterally.  Wrist: Right Inspection normal with no visible erythema or swelling. ROM smooth and normal with good flexion and extension and ulnar/radial deviation that is symmetrical with opposite wrist. Palpation is normal over metacarpals, navicular, lunate, and TFCC; tendons without tenderness/ swelling No snuffbox tenderness. No tenderness over Canal of Guyon. Strength 5/5 in all directions without pain. Negative Finkelstein, tinel's and phalens. Negative Watson's test. Patient's thumb though does have triggering of the IP joint. There is a nodule that is palpated that is tender to palpation but less so than the previous exam. Once again patient does have laxity of these joints bilaterally.    Impression and Recommendations:     This case required medical decision making of moderate complexity.

## 2014-03-25 NOTE — Assessment & Plan Note (Signed)
Discuss different treatment options. Patient would like to quickest release and I discussed with him the quickest relief would likely be surgical intervention which patient declined. I do feel as patient only been 2 weeks out we are little bit early for another steroid injection and we will consider that in about 1-2 weeks' time though. We discussed massage of the nodule as well as icing I could be beneficial. Patient was given a new prescription for topical anti-inflammatory as well to be helpful. Patient will try these interventions and come back in 10-14 days for further evaluation and treatment. At that time is continuing to have pain we may consider another injection.  Spent greater than 25 minutes with patient face-to-face and had greater than 50% of counseling including as described above in assessment and plan.

## 2014-04-05 ENCOUNTER — Other Ambulatory Visit (INDEPENDENT_AMBULATORY_CARE_PROVIDER_SITE_OTHER): Payer: BC Managed Care – PPO

## 2014-04-05 ENCOUNTER — Encounter: Payer: Self-pay | Admitting: Family Medicine

## 2014-04-05 ENCOUNTER — Ambulatory Visit (INDEPENDENT_AMBULATORY_CARE_PROVIDER_SITE_OTHER): Payer: BC Managed Care – PPO | Admitting: Family Medicine

## 2014-04-05 VITALS — BP 126/84 | HR 75 | Ht 70.0 in | Wt 157.0 lb

## 2014-04-05 DIAGNOSIS — M6530 Trigger finger, unspecified finger: Secondary | ICD-10-CM

## 2014-04-05 DIAGNOSIS — M65311 Trigger thumb, right thumb: Secondary | ICD-10-CM

## 2014-04-05 NOTE — Progress Notes (Signed)
  [facility] [place] [place], [place] [address] Phone: [phone] Subjective:     CC: Right thumb pain follow up  [id number] Derrick Villegas is a 53 y.o. male coming in for followup of his left thumb pain. Patient was seen previously and given anti-inflammatories as well as corticosteroid injection into the trigger finger. Last injection was 4 weeks ago. Patient states he is actually doing considerably better. Patient states that he is not having any triggering is not having any pain. Denies any numbness overall. Overall patient has been able to start working as well. Continues to wear the brace at night.     Past medical history, social, surgical and family history all reviewed in electronic medical record.   Review of Systems: No headache, visual changes, nausea, vomiting, diarrhea, constipation, dizziness, abdominal pain, skin rash, fevers, chills, night sweats, weight loss, swollen lymph nodes, body aches, joint swelling, muscle aches, chest pain, shortness of breath, mood changes.   Objective Blood pressure 126/84, pulse 75, height 5\' 10"  (1.778 m), weight 157 lb (71.215 kg), SpO2 99.00%.  General: No apparent distress alert and oriented x3 mood and affect normal, dressed appropriately.  HEENT: Pupils equal, extraocular movements intact  Respiratory: Patient's speak in full sentences and does not appear short of breath  Cardiovascular: No lower extremity edema, non tender, no erythema  Skin: Warm dry intact with no signs of infection or rash on extremities or on axial skeleton.  Abdomen: Soft nontender  Neuro: Cranial nerves II through XII are intact, neurovascularly intact in all extremities with 2+ DTRs and 2+ pulses.  Lymph: No lymphadenopathy of posterior or anterior cervical chain or axillae bilaterally.  Gait normal with good balance and coordination.  MSK:  Non tender with full range of motion and good stability and symmetric strength and  tone of shoulders, elbows,  hip, knee and ankles bilaterally.  Wrist: Right Inspection normal with no visible erythema or swelling. ROM smooth and normal with good flexion and extension and ulnar/radial deviation that is symmetrical with opposite wrist. Palpation is normal over metacarpals, navicular, lunate, and TFCC; tendons without tenderness/ swelling No snuffbox tenderness. No tenderness over Canal of Guyon. Strength 5/5 in all directions without pain. Negative Finkelstein, tinel's and phalens. Negative Watson's test. Patient's thumb has no triggering today. Patient is responding very well with full movement. No pain to palpation. Neurovascularly intact distally.  Limited musculoskeletal ultrasound shows patient does not have any significant fluid in the nodule is still present but significantly smaller than previous. Impression: Improved nodule in the tendon sheath. Significant decrease in hypoechoic changes.    Impression and Recommendations:     This case required medical decision making of moderate complexity.

## 2014-04-05 NOTE — Assessment & Plan Note (Signed)
The patient is doing significantly better than previously. Patient continued to do home exercises as well as manual massage. We'll continue taping for the next 2 weeks and then only as needed. Patient will come back again on an as-needed basis. Patient has any worsening problems he knows to increase the bracing. We did talk about different at home remedies. Patient will come back once again on an as-needed basis.  Spent greater than 25 minutes with patient face-to-face and had greater than 50% of counseling including as described above in assessment and plan.

## 2014-04-05 NOTE — Patient Instructions (Signed)
Good to see you Keep doing what you are doing.  Continue bracing for the next 2 weeks.  Come back when you need.

## 2014-05-25 ENCOUNTER — Ambulatory Visit (INDEPENDENT_AMBULATORY_CARE_PROVIDER_SITE_OTHER): Payer: BC Managed Care – PPO | Admitting: Internal Medicine

## 2014-05-25 ENCOUNTER — Encounter: Payer: Self-pay | Admitting: Internal Medicine

## 2014-05-25 VITALS — BP 140/90 | HR 71 | Temp 98.8°F | Resp 20 | Ht 70.0 in | Wt 164.0 lb

## 2014-05-25 DIAGNOSIS — H6500 Acute serous otitis media, unspecified ear: Secondary | ICD-10-CM

## 2014-05-25 DIAGNOSIS — H6502 Acute serous otitis media, left ear: Secondary | ICD-10-CM

## 2014-05-25 MED ORDER — FLUTICASONE PROPIONATE 50 MCG/ACT NA SUSP
2.0000 | Freq: Every day | NASAL | Status: DC
Start: 2014-05-25 — End: 2015-11-21

## 2014-05-25 NOTE — Progress Notes (Signed)
Pre visit review using our clinic review tool, if applicable. No additional management support is needed unless otherwise documented below in the visit note. 

## 2014-05-25 NOTE — Progress Notes (Signed)
Subjective:    Patient ID: Derrick Villegas, male    DOB: [birth year], 53 y.o.   MRN: [id number]  HPI  28 -year-old patient presents with a several day history of diminished hearing from the left ear.  There's been no pain, drainage, tinnitus.  He states that a very similar problem of 5 or 6 years ago. Denies any significant nasal congestion, or any recent URI,  Past Medical History  Diagnosis Date  . ANXIETY 08/29/2007  . COLONIC POLYPS, HX OF 03/24/2010  . DEPRESSION 08/22/2007  . Inflamed seborrheic keratosis 04/11/2010    History   Social History  . Marital Status: Single    Spouse Name: N/A    Number of Children: N/A  . Years of Education: N/A   Occupational History  . Not on file.   Social History Main Topics  . Smoking status: Former Research scientist (life sciences)  . Smokeless tobacco: Never Used  . Alcohol Use: No  . Drug Use: No  . Sexual Activity: Not on file   Other Topics Concern  . Not on file   Social History Narrative  . No narrative on file    Past Surgical History  Procedure Laterality Date  . Coccyx removal      Family History  Problem Relation Age of Onset  . Thyroid disease Mother   . Hypertension Father   . Kidney disease Father   . Hypertension Brother   . Kidney disease Brother   . Cancer Brother     Colon cancer    Allergies  Allergen Reactions  . Paroxetine     REACTION: nausea, tingling in arms    Current Outpatient Prescriptions on File Prior to Visit  Medication Sig Dispense Refill  . ALPRAZolam (XANAX) 0.5 MG tablet take 1 tablet by mouth three times a day if needed for sleep  60 tablet  3  . aspirin 81 MG tablet Take 81 mg by mouth daily.      . Multiple Vitamin (MULTIVITAMIN) tablet Take 1 tablet by mouth daily.      . Omega-3 Fatty Acids (FISH OIL) 1000 MG CAPS Take 1 capsule by mouth daily.       No current facility-administered medications on file prior to visit.    BP 140/90  Pulse 71  Temp(Src) 98.8 F (37.1 C) (Oral)  Resp 20  Ht 5'  10" (1.778 m)  Wt 164 lb (74.39 kg)  BMI 23.53 kg/m2  SpO2 97%       Review of Systems  Constitutional: Negative for fever, chills, appetite change and fatigue.  HENT: Positive for hearing loss. Negative for congestion, dental problem, ear pain, sore throat, tinnitus, trouble swallowing and voice change.   Eyes: Negative for pain, discharge and visual disturbance.  Respiratory: Negative for cough, chest tightness, wheezing and stridor.   Cardiovascular: Negative for chest pain, palpitations and leg swelling.  Gastrointestinal: Negative for nausea, vomiting, abdominal pain, diarrhea, constipation, blood in stool and abdominal distention.  Genitourinary: Negative for urgency, hematuria, flank pain, discharge, difficulty urinating and genital sores.  Musculoskeletal: Negative for arthralgias, back pain, gait problem, joint swelling, myalgias and neck stiffness.  Skin: Negative for rash.  Neurological: Negative for dizziness, syncope, speech difficulty, weakness, numbness and headaches.  Hematological: Negative for adenopathy. Does not bruise/bleed easily.  Psychiatric/Behavioral: Negative for behavioral problems and dysphoric mood. The patient is not nervous/anxious.        Objective:   Physical Exam  Constitutional: He is oriented to person, place, and time.  He appears well-developed.  HENT:  Head: Normocephalic.  Right Ear: External ear normal.  Left Ear: External ear normal.  The left tympanic membrane was slightly dull Weber lateralized to the left  Hearing diminished left ear  Eyes: Conjunctivae and EOM are normal.  Neck: Normal range of motion.  Cardiovascular: Normal rate and normal heart sounds.   Pulmonary/Chest: Breath sounds normal.  Abdominal: Bowel sounds are normal.  Musculoskeletal: Normal range of motion. He exhibits no edema and no tenderness.  Neurological: He is alert and oriented to person, place, and time.  Psychiatric: He has a normal mood and affect.  His behavior is normal.          Assessment & Plan:   Serous otitis media.  Will treat with nasal steroids, decongestants, and observe CPX as scheduled

## 2014-05-25 NOTE — Patient Instructions (Addendum)
Use saline irrigation, warm  moist compresses and over-the-counter decongestants only as directed.  Call if there is no improvement in 5 to 7 days, or sooner if you develop increasing pain, fever, or any new symptoms.  Fluticasone nasal spray daily

## 2014-06-29 ENCOUNTER — Other Ambulatory Visit (INDEPENDENT_AMBULATORY_CARE_PROVIDER_SITE_OTHER): Payer: BC Managed Care – PPO

## 2014-06-29 DIAGNOSIS — Z0000 Encounter for general adult medical examination without abnormal findings: Secondary | ICD-10-CM

## 2014-06-29 LAB — CBC WITH DIFFERENTIAL/PLATELET
BASOS ABS: 0 10*3/uL (ref 0.0–0.1)
BASOS PCT: 0.6 % (ref 0.0–3.0)
Eosinophils Absolute: 0.3 10*3/uL (ref 0.0–0.7)
Eosinophils Relative: 3.1 % (ref 0.0–5.0)
HEMATOCRIT: 46.5 % (ref 39.0–52.0)
HEMOGLOBIN: 15.4 g/dL (ref 13.0–17.0)
LYMPHS ABS: 2.6 10*3/uL (ref 0.7–4.0)
Lymphocytes Relative: 30.7 % (ref 12.0–46.0)
MCHC: 33 g/dL (ref 30.0–36.0)
MCV: 95.4 fl (ref 78.0–100.0)
Monocytes Absolute: 1.2 10*3/uL — ABNORMAL HIGH (ref 0.1–1.0)
Monocytes Relative: 14.4 % — ABNORMAL HIGH (ref 3.0–12.0)
NEUTROS ABS: 4.3 10*3/uL (ref 1.4–7.7)
Neutrophils Relative %: 51.2 % (ref 43.0–77.0)
Platelets: 291 10*3/uL (ref 150.0–400.0)
RBC: 4.87 Mil/uL (ref 4.22–5.81)
RDW: 14.3 % (ref 11.5–15.5)
WBC: 8.5 10*3/uL (ref 4.0–10.5)

## 2014-06-29 LAB — POCT URINALYSIS DIPSTICK
Bilirubin, UA: NEGATIVE
GLUCOSE UA: NEGATIVE
Ketones, UA: NEGATIVE
LEUKOCYTES UA: NEGATIVE
NITRITE UA: NEGATIVE
Spec Grav, UA: 1.02
Urobilinogen, UA: 0.2
pH, UA: 6.5

## 2014-06-29 LAB — BASIC METABOLIC PANEL
BUN: 15 mg/dL (ref 6–23)
CHLORIDE: 103 meq/L (ref 96–112)
CO2: 27 meq/L (ref 19–32)
Calcium: 9.7 mg/dL (ref 8.4–10.5)
Creatinine, Ser: 1.2 mg/dL (ref 0.4–1.5)
GFR: 70.06 mL/min (ref >60.00)
GLUCOSE: 93 mg/dL (ref 70–99)
Potassium: 4.7 mEq/L (ref 3.5–5.1)
SODIUM: 140 meq/L (ref 135–145)

## 2014-06-29 LAB — LIPID PANEL
Cholesterol: 247 mg/dL — ABNORMAL HIGH (ref 0–200)
HDL: 61.5 mg/dL (ref >39.00)
LDL Cholesterol: 165 mg/dL — ABNORMAL HIGH (ref 0–99)
NONHDL: 185.5
Total CHOL/HDL Ratio: 4
Triglycerides: 103 mg/dL (ref 0.0–149.0)
VLDL: 20.6 mg/dL (ref 0.0–40.0)

## 2014-06-29 LAB — HEPATIC FUNCTION PANEL
ALBUMIN: 4.6 g/dL (ref 3.5–5.2)
ALT: 21 U/L (ref 0–53)
AST: 26 U/L (ref 0–37)
Alkaline Phosphatase: 83 U/L (ref 39–117)
Bilirubin, Direct: 0.1 mg/dL (ref 0.0–0.3)
Total Bilirubin: 0.7 mg/dL (ref 0.2–1.2)
Total Protein: 7.5 g/dL (ref 6.0–8.3)

## 2014-06-29 LAB — PSA: PSA: 0.96 ng/mL (ref 0.10–4.00)

## 2014-06-29 LAB — TSH: TSH: 2.88 u[IU]/mL (ref 0.35–4.50)

## 2014-07-06 ENCOUNTER — Ambulatory Visit (INDEPENDENT_AMBULATORY_CARE_PROVIDER_SITE_OTHER): Payer: BC Managed Care – PPO | Admitting: Internal Medicine

## 2014-07-06 ENCOUNTER — Encounter: Payer: Self-pay | Admitting: Internal Medicine

## 2014-07-06 VITALS — BP 138/94 | HR 75 | Temp 98.0°F | Resp 20 | Ht 69.5 in | Wt 161.0 lb

## 2014-07-06 DIAGNOSIS — Z86010 Personal history of colonic polyps: Secondary | ICD-10-CM

## 2014-07-06 DIAGNOSIS — Z800 Family history of malignant neoplasm of digestive organs: Secondary | ICD-10-CM

## 2014-07-06 DIAGNOSIS — Z0000 Encounter for general adult medical examination without abnormal findings: Secondary | ICD-10-CM

## 2014-07-06 MED ORDER — ALPRAZOLAM 0.5 MG PO TABS: ORAL_TABLET | ORAL | Status: DC

## 2014-07-06 NOTE — Patient Instructions (Addendum)
It is important that you exercise regularly, at least 20 minutes 3 to 4 times per week.  If you develop chest pain or shortness of breath seek  medical attention.  Return in one year for follow-up  Health Maintenance A healthy lifestyle and preventative care can promote health and wellness.  Maintain regular health, dental, and eye exams.  Eat a healthy diet. Foods like vegetables, fruits, whole grains, low-fat dairy products, and lean protein foods contain the nutrients you need and are low in calories. Decrease your intake of foods high in solid fats, added sugars, and salt. Get information about a proper diet from your health care provider, if necessary.  Regular physical exercise is one of the most important things you can do for your health. Most adults should get at least 150 minutes of moderate-intensity exercise (any activity that increases your heart rate and causes you to sweat) each week. In addition, most adults need muscle-strengthening exercises on 2 or more days a week.   Maintain a healthy weight. The body mass index (BMI) is a screening tool to identify possible weight problems. It provides an estimate of body fat based on height and weight. Your health care provider can find your BMI and can help you achieve or maintain a healthy weight. For males 20 years and older:  A BMI below 18.5 is considered underweight.  A BMI of 18.5 to 24.9 is normal.  A BMI of 25 to 29.9 is considered overweight.  A BMI of 30 and above is considered obese.  Maintain normal blood lipids and cholesterol by exercising and minimizing your intake of saturated fat. Eat a balanced diet with plenty of fruits and vegetables. Blood tests for lipids and cholesterol should begin at age 20 and be repeated every 5 years. If your lipid or cholesterol levels are high, you are over age 50, or you are at high risk for heart disease, you may need your cholesterol levels checked more frequently.Ongoing high lipid  and cholesterol levels should be treated with medicines if diet and exercise are not working.  If you smoke, find out from your health care provider how to quit. If you do not use tobacco, do not start.  Lung cancer screening is recommended for adults aged 55-80 years who are at high risk for developing lung cancer because of a history of smoking. A yearly low-dose CT scan of the lungs is recommended for people who have at least a 30-pack-year history of smoking and are current smokers or have quit within the past 15 years. A pack year of smoking is smoking an average of 1 pack of cigarettes a day for 1 year (for example, a 30-pack-year history of smoking could mean smoking 1 pack a day for 30 years or 2 packs a day for 15 years). Yearly screening should continue until the smoker has stopped smoking for at least 15 years. Yearly screening should be stopped for people who develop a health problem that would prevent them from having lung cancer treatment.  If you choose to drink alcohol, do not have more than 2 drinks per day. One drink is considered to be 12 oz (360 mL) of beer, 5 oz (150 mL) of wine, or 1.5 oz (45 mL) of liquor.  Avoid the use of street drugs. Do not share needles with anyone. Ask for help if you need support or instructions about stopping the use of drugs.  High blood pressure causes heart disease and increases the risk of stroke. Blood   should be checked at least every 1-2 years. Ongoing high blood pressure should be treated with medicines if weight loss and exercise are not effective.  If you are 45-79 years old, ask your health care provider if you should take aspirin to prevent heart disease.  Diabetes screening involves taking a blood sample to check your fasting blood sugar level. This should be done once every 3 years after age 45 if you are at a normal weight and without risk factors for diabetes. Testing should be considered at a younger age or be carried out more  frequently if you are overweight and have at least 1 risk factor for diabetes.  Colorectal cancer can be detected and often prevented. Most routine colorectal cancer screening begins at the age of 50 and continues through age 75. However, your health care provider may recommend screening at an earlier age if you have risk factors for colon cancer. On a yearly basis, your health care provider may provide home test kits to check for hidden blood in the stool. A small camera at the end of a tube may be used to directly examine the colon (sigmoidoscopy or colonoscopy) to detect the earliest forms of colorectal cancer. Talk to your health care provider about this at age 50 when routine screening begins. A direct exam of the colon should be repeated every 5-10 years through age 75, unless early forms of precancerous polyps or small growths are found.  People who are at an increased risk for hepatitis B should be screened for this virus. You are considered at high risk for hepatitis B if:  You were born in a country where hepatitis B occurs often. Talk with your health care provider about which countries are considered high risk.  Your parents were born in a high-risk country and you have not received a shot to protect against hepatitis B (hepatitis B vaccine).  You have HIV or AIDS.  You use needles to inject street drugs.  You live with, or have sex with, someone who has hepatitis B.  You are a man who has sex with other men (MSM).  You get hemodialysis treatment.  You take certain medicines for conditions like cancer, organ transplantation, and autoimmune conditions.  Hepatitis C blood testing is recommended for all people born from 1945 through 1965 and any individual with known risk factors for hepatitis C.  Healthy men should no longer receive prostate-specific antigen (PSA) blood tests as part of routine cancer screening. Talk to your health care provider about prostate cancer  screening.  Testicular cancer screening is not recommended for adolescents or adult males who have no symptoms. Screening includes self-exam, a health care provider exam, and other screening tests. Consult with your health care provider about any symptoms you have or any concerns you have about testicular cancer.  Practice safe sex. Use condoms and avoid high-risk sexual practices to reduce the spread of sexually transmitted infections (STIs).  You should be screened for STIs, including gonorrhea and chlamydia if:  You are sexually active and are younger than 24 years.  You are older than 24 years, and your health care provider tells you that you are at risk for this type of infection.  Your sexual activity has changed since you were last screened, and you are at an increased risk for chlamydia or gonorrhea. Ask your health care provider if you are at risk.  If you are at risk of being infected with HIV, it is recommended that you take a   prescription medicine daily to prevent HIV infection. This is called pre-exposure prophylaxis (PrEP). You are considered at risk if:  You are a man who has sex with other men (MSM).  You are a heterosexual man who is sexually active with multiple partners.  You take drugs by injection.  You are sexually active with a partner who has HIV.  Talk with your health care provider about whether you are at high risk of being infected with HIV. If you choose to begin PrEP, you should first be tested for HIV. You should then be tested every 3 months for as long as you are taking PrEP.  Use sunscreen. Apply sunscreen liberally and repeatedly throughout the day. You should seek shade when your shadow is shorter than you. Protect yourself by wearing long sleeves, pants, a wide-brimmed hat, and sunglasses year round whenever you are outdoors.  Tell your health care provider of new moles or changes in moles, especially if there is a change in shape or color. Also, tell  your health care provider if a mole is larger than the size of a pencil eraser.  A one-time screening for abdominal aortic aneurysm (AAA) and surgical repair of large AAAs by ultrasound is recommended for men aged 68-75 years who are current or former smokers.  Stay current with your vaccines (immunizations). Document Released: 05/03/2008 Document Revised: 11/10/2013 Document Reviewed: 04/02/2011 [facility] Patient Information 2015 [place], [place]. This information is not intended to replace advice given to you by your health care provider. Make sure you discuss any questions you have with your health care provider.  Schedule your colonoscopy to help detect colon cancer.Cardiac Diet This diet can help prevent heart disease and stroke. Many factors influence your heart health, including eating and exercise habits. Coronary risk rises a lot with abnormal blood fat (lipid) levels. Cardiac meal planning includes limiting unhealthy fats, increasing healthy fats, and making other small dietary changes. General guidelines are as follows:  Adjust calorie intake to reach and maintain desirable body weight.  Limit total fat intake to less than 30% of total calories. Saturated fat should be less than 7% of calories.  Saturated fats are found in animal products and in some vegetable products. Saturated vegetable fats are found in coconut oil, cocoa butter, palm oil, and palm kernel oil. Read labels carefully to avoid these products as much as possible. Use butter in moderation. Choose tub margarines and oils that have 2 grams of fat or less. Good cooking oils are canola and olive oils.  Practice low-fat cooking techniques. Do not fry food. Instead, broil, bake, boil, steam, grill, roast on a rack, stir-fry, or microwave it. Other fat reducing suggestions include:  Remove the skin from poultry.  Remove all visible fat from meats.  Skim the fat off stews, soups, and gravies before serving them.  Steam  vegetables in water or broth instead of sauting them in fat.  Avoid foods with trans fat (or hydrogenated oils), such as commercially fried foods and commercially baked goods. Commercial shortening and deep-frying fats will contain trans fat.  Increase intake of fruits, vegetables, whole grains, and legumes to replace foods high in fat.  Increase consumption of nuts, legumes, and seeds to at least 4 servings weekly. One serving of a legume equals  cup, and 1 serving of nuts or seeds equals  cup.  Choose whole grains more often. Have 3 servings per day (a serving is 1 ounce [oz]).  Eat 4 to 5 servings of vegetables per day.  A serving of vegetables is 1 cup of raw leafy vegetables;  cup of raw or cooked cut-up vegetables;  cup of vegetable juice.  Eat 4 to 5 servings of fruit per day. A serving of fruit is 1 medium whole fruit;  cup of dried fruit;  cup of fresh, frozen, or canned fruit;  cup of 100% fruit juice.  Increase your intake of dietary fiber to 20 to 30 grams per day. Insoluble fiber may help lower your risk of heart disease and may help curb your appetite.  Soluble fiber binds cholesterol to be removed from the blood. Foods high in soluble fiber are dried beans, citrus fruits, oats, apples, bananas, broccoli, Brussels sprouts, and eggplant.  Try to include foods fortified with plant sterols or stanols, such as yogurt, breads, juices, or margarines. Choose several fortified foods to achieve a daily intake of 2 to 3 grams of plant sterols or stanols.  Foods with omega-3 fats can help reduce your risk of heart disease. Aim to have a 3.5 oz portion of fatty fish twice per week, such as salmon, mackerel, albacore tuna, sardines, lake trout, or herring. If you wish to take a fish oil supplement, choose one that contains 1 gram of both DHA and EPA.  Limit processed meats to 2 servings (3 oz portion) weekly.  Limit the sodium in your diet to 1500 milligrams (mg) per day. If you have  high blood pressure, talk to a registered dietitian about a DASH (Dietary Approaches to Stop Hypertension) eating plan.  Limit sweets and beverages with added sugar, such as soda, to no more than 5 servings per week. One serving is:   1 tablespoon sugar.  1 tablespoon jelly or jam.   cup sorbet.  1 cup lemonade.   cup regular soda. CHOOSING FOODS Starches  Allowed: Breads: All kinds (wheat, rye, raisin, white, oatmeal, [place], [place], and English muffin bread). Low-fat rolls: English muffins, frankfurter and hamburger buns, bagels, pita bread, tortillas (not fried). Pancakes, waffles, biscuits, and muffins made with recommended oil.  Avoid: Products made with saturated or trans fats, oils, or whole milk products. Butter rolls, cheese breads, croissants. Commercial doughnuts, muffins, sweet rolls, biscuits, waffles, pancakes, store-bought mixes. Crackers  Allowed: Low-fat crackers and snacks: Animal, graham, rye, saltine (with recommended oil, no lard), oyster, and matzo crackers. Bread sticks, melba toast, rusks, flatbread, pretzels, and light popcorn.  Avoid: High-fat crackers: cheese crackers, butter crackers, and those made with coconut, palm oil, or trans fat (hydrogenated oils). Buttered popcorn. Cereals  Allowed: Hot or cold whole-grain cereals.  Avoid: Cereals containing coconut, hydrogenated vegetable fat, or animal fat. Potatoes / Pasta / Rice  Allowed: All kinds of potatoes, rice, and pasta (such as macaroni, spaghetti, and noodles).  Avoid: Pasta or rice prepared with cream sauce or high-fat cheese. Chow mein noodles, [place] fries. Vegetables  Allowed: All vegetables and vegetable juices.  Avoid: Fried vegetables. Vegetables in cream, butter, or high-fat cheese sauces. Limit coconut. Fruit in cream or custard. Protein  Allowed: Limit your intake of meat, seafood, and poultry to no more than 6 oz (cooked weight) per day. All lean, well-trimmed beef, veal, pork,  and lamb. All chicken and [place] without skin. All fish and shellfish. Wild game: wild duck, rabbit, pheasant, and venison. Egg whites or low-cholesterol egg substitutes may be used as desired. Meatless dishes: recipes with dried beans, peas, lentils, and tofu (soybean curd). Seeds and nuts: all seeds and most nuts.  Avoid: Prime grade and other heavily marbled  and fatty meats, such as short ribs, spare ribs, rib eye roast or steak, frankfurters, sausage, bacon, and high-fat luncheon meats, mutton. Caviar. Commercially fried fish. Domestic duck, goose, venison sausage. Organ meats: liver, gizzard, heart, chitterlings, brains, kidney, sweetbreads. Dairy  Allowed: Low-fat cheeses: nonfat or low-fat cottage cheese (1% or 2% fat), cheeses made with part skim milk, such as mozzarella, farmers, string, or ricotta. (Cheeses should be labeled no more than 2 to 6 grams fat per oz.). Skim (or 1%) milk: liquid, powdered, or evaporated. Buttermilk made with low-fat milk. Drinks made with skim or low-fat milk or cocoa. Chocolate milk or cocoa made with skim or low-fat (1%) milk. Nonfat or low-fat yogurt.  Avoid: Whole milk cheeses, including colby, cheddar, [place], Monterey Jack, [place], [place], [place], American, Swiss, and blue. Creamed cottage cheese, cream cheese. Whole milk and whole milk products, including buttermilk or yogurt made from whole milk, drinks made from whole milk. Condensed milk, evaporated whole milk, and 2% milk. Soups and Combination Foods  Allowed: Low-fat low-sodium soups: broth, dehydrated soups, homemade broth, soups with the fat removed, homemade cream soups made with skim or low-fat milk. Low-fat spaghetti, lasagna, chili, and Spanish rice if low-fat ingredients and low-fat cooking techniques are used.  Avoid: Cream soups made with whole milk, cream, or high-fat cheese. All other soups. Desserts and Sweets  Allowed: Sherbet, fruit ices, gelatins, meringues, and angel food cake.  Homemade desserts with recommended fats, oils, and milk products. Jam, jelly, honey, marmalade, sugars, and syrups. Pure sugar candy, such as gum drops, hard candy, jelly beans, marshmallows, mints, and small amounts of dark chocolate.  Avoid: Commercially prepared cakes, pies, cookies, frosting, pudding, or mixes for these products. Desserts containing whole milk products, chocolate, coconut, lard, palm oil, or palm kernel oil. Ice cream or ice cream drinks. Candy that contains chocolate, coconut, butter, hydrogenated fat, or unknown ingredients. Buttered syrups. Fats and Oils  Allowed: Vegetable oils: safflower, sunflower, corn, soybean, cottonseed, sesame, canola, olive, or peanut. Non-hydrogenated margarines. Salad dressing or mayonnaise: homemade or commercial, made with a recommended oil. Low or nonfat salad dressing or mayonnaise.  Limit added fats and oils to 6 to 8 tsp per day (includes fats used in cooking, baking, salads, and spreads on bread). Remember to count the "hidden fats" in foods.  Avoid: Solid fats and shortenings: butter, lard, salt pork, bacon drippings. Gravy containing meat fat, shortening, or suet. Cocoa butter, coconut. Coconut oil, palm oil, palm kernel oil, or hydrogenated oils: these ingredients are often used in bakery products, nondairy creamers, whipped toppings, candy, and commercially fried foods. Read labels carefully. Salad dressings made of unknown oils, sour cream, or cheese, such as blue cheese and Roquefort. Cream, all kinds: half-and-half, light, heavy, or whipping. Sour cream or cream cheese (even if "light" or low-fat). Nondairy cream substitutes: coffee creamers and sour cream substitutes made with palm, palm kernel, hydrogenated oils, or coconut oil. Beverages  Allowed: Coffee (regular or decaffeinated), tea. Diet carbonated beverages, mineral water. Alcohol: Check with your caregiver. Moderation is recommended.  Avoid: Whole milk, regular sodas, and juice  drinks with added sugar. Condiments  Allowed: All seasonings and condiments. Cocoa powder. "Cream" sauces made with recommended ingredients.  Avoid: Carob powder made with hydrogenated fats. SAMPLE MENU Breakfast   cup orange juice   cup oatmeal  1 slice toast  1 tsp margarine  1 cup skim milk Lunch  [place] sandwich with 2 oz [place], 2 slices bread  Lettuce and tomato slices  Fresh fruit  Carrot sticks  Coffee or tea Snack  Fresh fruit or low-fat crackers Dinner  3 oz lean ground beef  1 baked potato  1 tsp margarine   cup asparagus  Lettuce salad  1 tbs non-creamy dressing   cup peach slices  1 cup skim milk Document Released: 08/14/2008 Document Revised: 05/06/2012 Document Reviewed: 01/05/2014 [employer] Patient Information 2015 [place], [place]. This information is not intended to replace advice given to you by your health care provider. Make sure you discuss any questions you have with your health care provider.

## 2014-07-06 NOTE — Progress Notes (Signed)
Subjective:    Patient ID: Derrick Villegas, male    DOB: 1961-08-14, 53 y.o.   MRN: [id number]  HPI  53 year old patient who is seen today for a preventive health examination.  He has a history of mild anxiety disorder and allergic rhinitis.  In general, does quite well.  He has a personal history of colonic polyps and also a first degree relative (.  Brother) history of colon cancer.  No concerns or complaints  Family history father had a history of prostate cancer and died of pancreatic cancer.  Mother is her recent diagnosis of retinal melanoma.  Paternal aunt has a history of lung cancer.  Brother is followed for colon cancer  Past Medical History  Diagnosis Date  . ANXIETY 08/29/2007  . COLONIC POLYPS, HX OF 03/24/2010  . DEPRESSION 08/22/2007  . Inflamed seborrheic keratosis 04/11/2010    History   Social History  . Marital Status: Single    Spouse Name: N/A    Number of Children: N/A  . Years of Education: N/A   Occupational History  . Not on file.   Social History Main Topics  . Smoking status: Former Research scientist (life sciences)  . Smokeless tobacco: Never Used  . Alcohol Use: No  . Drug Use: No  . Sexual Activity: Not on file   Other Topics Concern  . Not on file   Social History Narrative  . No narrative on file    Past Surgical History  Procedure Laterality Date  . Coccyx removal      Family History  Problem Relation Age of Onset  . Thyroid disease Mother   . Hypertension Father   . Kidney disease Father   . Hypertension Brother   . Kidney disease Brother   . Cancer Brother     Colon cancer    Allergies  Allergen Reactions  . Paroxetine     REACTION: nausea, tingling in arms    Current Outpatient Prescriptions on File Prior to Visit  Medication Sig Dispense Refill  . aspirin 81 MG tablet Take 81 mg by mouth daily.      . fluticasone (FLONASE) 50 MCG/ACT nasal spray Place 2 sprays into both nostrils daily.  16 g  6  . Multiple Vitamin (MULTIVITAMIN) tablet Take  1 tablet by mouth daily.      . Omega-3 Fatty Acids (FISH OIL) 1000 MG CAPS Take 1 capsule by mouth daily.       No current facility-administered medications on file prior to visit.    BP 138/94  Pulse 75  Temp(Src) 98 F (36.7 C) (Oral)  Resp 20  Ht 5' 9.5" (1.765 m)  Wt 161 lb (73.029 kg)  BMI 23.44 kg/m2  SpO2 98%     Review of Systems  Constitutional: Negative for fever, chills, appetite change and fatigue.  HENT: Negative for congestion, dental problem, ear pain, hearing loss, sore throat, tinnitus, trouble swallowing and voice change.   Eyes: Negative for pain, discharge and visual disturbance.  Respiratory: Negative for cough, chest tightness, wheezing and stridor.   Cardiovascular: Negative for chest pain, palpitations and leg swelling.  Gastrointestinal: Negative for nausea, vomiting, abdominal pain, diarrhea, constipation, blood in stool and abdominal distention.  Genitourinary: Negative for urgency, hematuria, flank pain, discharge, difficulty urinating and genital sores.  Musculoskeletal: Negative for arthralgias, back pain, gait problem, joint swelling, myalgias and neck stiffness.  Skin: Negative for rash.  Neurological: Negative for dizziness, syncope, speech difficulty, weakness, numbness and headaches.  Hematological: Negative for  adenopathy. Does not bruise/bleed easily.  Psychiatric/Behavioral: Negative for behavioral problems and dysphoric mood. The patient is not nervous/anxious.        Objective:   Physical Exam  Constitutional: He appears well-developed and well-nourished.  HENT:  Head: Normocephalic and atraumatic.  Right Ear: External ear normal.  Left Ear: External ear normal.  Nose: Nose normal.  Mouth/Throat: Oropharynx is clear and moist.  Eyes: Conjunctivae and EOM are normal. Pupils are equal, round, and reactive to light. No scleral icterus.  Neck: Normal range of motion. Neck supple. No JVD present. No thyromegaly present.    Cardiovascular: Regular rhythm, normal heart sounds and intact distal pulses.  Exam reveals no gallop and no friction rub.   No murmur heard. Pulmonary/Chest: Effort normal and breath sounds normal. He exhibits no tenderness.  Abdominal: Soft. Bowel sounds are normal. He exhibits no distension and no mass. There is no tenderness.  Genitourinary: Prostate normal and penis normal. Guaiac negative stool.  Musculoskeletal: Normal range of motion. He exhibits no edema and no tenderness.  Lymphadenopathy:    He has no cervical adenopathy.  Neurological: He is alert. He has normal reflexes. No cranial nerve deficit. Coordination normal.  Skin: Skin is warm and dry. No rash noted.  Psychiatric: He has a normal mood and affect. His behavior is normal.          Assessment & Plan:   Preventive health exam Family history of colon cancer and personal history colonic polyps.  Will set up for his five-year colonoscopy Anxiety disorder.  Alprazolam refilled  Recheck one year

## 2014-07-06 NOTE — Progress Notes (Signed)
Pre visit review using our clinic review tool, if applicable. No additional management support is needed unless otherwise documented below in the visit note. 

## 2014-07-16 ENCOUNTER — Encounter: Payer: Self-pay | Admitting: Gastroenterology

## 2014-08-09 ENCOUNTER — Encounter: Payer: Self-pay | Admitting: Internal Medicine

## 2014-09-03 ENCOUNTER — Encounter: Payer: Self-pay | Admitting: Gastroenterology

## 2014-10-19 HISTORY — PX: POLYPECTOMY: SHX149

## 2014-10-19 HISTORY — PX: COLONOSCOPY: SHX174

## 2014-11-03 ENCOUNTER — Ambulatory Visit (AMBULATORY_SURGERY_CENTER): Payer: Self-pay | Admitting: *Deleted

## 2014-11-03 VITALS — Ht 69.75 in | Wt 164.2 lb

## 2014-11-03 DIAGNOSIS — Z86010 Personal history of colonic polyps: Secondary | ICD-10-CM

## 2014-11-03 DIAGNOSIS — Z800 Family history of malignant neoplasm of digestive organs: Secondary | ICD-10-CM

## 2014-11-03 MED ORDER — MOVIPREP 100 G PO SOLR: 1.0000 | Freq: Once | ORAL | Status: DC

## 2014-11-03 NOTE — Progress Notes (Signed)
Denies allergies to eggs or soy products. Denies complications with sedation or anesthesia. Denies O2 use. Denies use of diet or weight loss medications.  Emmi instructions given for colonoscopy.  

## 2014-11-16 ENCOUNTER — Encounter: Payer: Self-pay | Admitting: Gastroenterology

## 2014-11-16 ENCOUNTER — Ambulatory Visit (AMBULATORY_SURGERY_CENTER): Payer: BC Managed Care – PPO | Admitting: Gastroenterology

## 2014-11-16 VITALS — BP 137/73 | HR 63 | Temp 96.9°F | Resp 25 | Ht 69.75 in | Wt 164.0 lb

## 2014-11-16 DIAGNOSIS — Z86010 Personal history of colonic polyps: Secondary | ICD-10-CM

## 2014-11-16 DIAGNOSIS — D123 Benign neoplasm of transverse colon: Secondary | ICD-10-CM

## 2014-11-16 MED ORDER — SODIUM CHLORIDE 0.9 % IV SOLN: 500.0000 mL | INTRAVENOUS | Status: DC

## 2014-11-16 NOTE — Progress Notes (Signed)
Called to room to assist during endoscopic procedure.  Patient ID and intended procedure confirmed with present staff. Received instructions for my participation in the procedure from the performing physician.  

## 2014-11-16 NOTE — Patient Instructions (Signed)
YOU HAD AN ENDOSCOPIC PROCEDURE TODAY AT THE [facility]: Refer to the procedure report that was given to you for any specific questions about what was found during the examination.  If the procedure report does not answer your questions, please call your gastroenterologist to clarify.  If you requested that your care partner not be given the details of your procedure findings, then the procedure report has been included in a sealed envelope for you to review at your convenience later.  YOU SHOULD EXPECT: Some feelings of bloating in the abdomen. Passage of more gas than usual.  Walking can help get rid of the air that was put into your GI tract during the procedure and reduce the bloating. If you had a lower endoscopy (such as a colonoscopy or flexible sigmoidoscopy) you may notice spotting of blood in your stool or on the toilet paper. If you underwent a bowel prep for your procedure, then you may not have a normal bowel movement for a few days.  DIET: Your first meal following the procedure should be a light meal and then it is ok to progress to your normal diet.  A half-sandwich or bowl of soup is an example of a good first meal.  Heavy or fried foods are harder to digest and may make you feel nauseous or bloated.  Likewise meals heavy in dairy and vegetables can cause extra gas to form and this can also increase the bloating.  Drink plenty of fluids but you should avoid alcoholic beverages for 24 hours.  ACTIVITY: Your care partner should take you home directly after the procedure.  You should plan to take it easy, moving slowly for the rest of the day.  You can resume normal activity the day after the procedure however you should NOT DRIVE or use heavy machinery for 24 hours (because of the sedation medicines used during the test).    SYMPTOMS TO REPORT IMMEDIATELY: A gastroenterologist can be reached at any hour.  During normal business hours, 8:30 AM to 5:00 PM Monday through Friday,  call [phone].  After hours and on weekends, please call the GI answering service at [phone] who will take a message and have the physician on call contact you.   Following lower endoscopy (colonoscopy or flexible sigmoidoscopy):  Excessive amounts of blood in the stool  Significant tenderness or worsening of abdominal pains  Swelling of the abdomen that is new, acute  Fever of 100F or higher  FOLLOW UP: If any biopsies were taken you will be contacted by phone or by letter within the next 1-3 weeks.  Call your gastroenterologist if you have not heard about the biopsies in 3 weeks.  Our staff will call the home number listed on your records the next business day following your procedure to check on you and address any questions or concerns that you may have at that time regarding the information given to you following your procedure. This is a courtesy call and so if there is no answer at the home number and we have not heard from you through the emergency physician on call, we will assume that you have returned to your regular daily activities without incident.  SIGNATURES/CONFIDENTIALITY: You and/or your care partner have signed paperwork which will be entered into your electronic medical record.  These signatures attest to the fact that that the information above on your After Visit Summary has been reviewed and is understood.  Full responsibility of the confidentiality of this   discharge information lies with you and/or your care-partner.  Await pathology  Please read over handouts about polyps, diverticulosis and high fiber diets  Push fluids  Next colonoscopy- 5 years

## 2014-11-16 NOTE — Progress Notes (Signed)
Procedure ends, to recovery, report given and VSS. 

## 2014-11-16 NOTE — Op Note (Signed)
[place]  [address] [place], [address]   COLONOSCOPY PROCEDURE REPORT  PATIENT: Derrick Villegas, Derrick Villegas  MR#: [id number] BIRTHDATE: 01/18/1961 , 57  yrs. old GENDER: male ENDOSCOPIST: Ladene Artist, MD, [facility] PROCEDURE DATE:  11/16/2014 PROCEDURE:   Colonoscopy with snare polypectomy First Screening Colonoscopy - Avg.  risk and is 50 yrs.  old or older - No.  Prior Negative Screening - Now for repeat screening. N/A  History of Adenoma - Now for follow-up colonoscopy & has been > or = to 3 yrs.  Yes hx of adenoma.  Has been 3 or more years since last colonoscopy.  Polyps Removed Today? Yes. ASA CLASS:   Class II INDICATIONS:surveillance colonoscopy based on a history of adenomatous colonic polyp(s). MEDICATIONS: Monitored anesthesia care and Propofol 300 mg IV DESCRIPTION OF PROCEDURE:   After the risks benefits and alternatives of the procedure were thoroughly explained, informed consent was obtained.  The digital rectal exam revealed no abnormalities of the rectum.   The LB [id number]  endoscope was introduced through the anus and advanced to the cecum, which was identified by both the appendix and ileocecal valve. No adverse events experienced.   The quality of the prep was good, using MoviPrep  The instrument was then slowly withdrawn as the colon was fully examined.  COLON FINDINGS: A sessile polyp measuring 6 mm in size was found in the transverse colon.  A polypectomy was performed with a cold snare.  The resection was complete, the polyp tissue was completely retrieved and sent to histology.   There was mild diverticulosis noted in the sigmoid colon.   The examination was otherwise normal. Retroflexed views revealed no abnormalities. The time to cecum=2 minutes 01 seconds.  Withdrawal time=11 minutes 27 seconds.  The scope was withdrawn and the procedure completed. COMPLICATIONS: There were no immediate complications.  ENDOSCOPIC IMPRESSION: 1.    Sessile polyp in the transverse colon; polypectomy performed with a cold snare 2.   Mild diverticulosis in the sigmoid colon 3.   The examination was otherwise normal  RECOMMENDATIONS: 1.  Await pathology results 2.  High fiber diet with liberal fluid intake. 3.  Repeat Colonoscopy in 5 years.  eSigned:  Ladene Artist, MD, [facility] 11/16/2014 10:04 AM

## 2014-11-17 ENCOUNTER — Telehealth: Payer: Self-pay

## 2014-11-17 NOTE — Telephone Encounter (Signed)
  Follow up Call-  Call back number 11/16/2014  Post procedure Call Back phone  # [phone] hm  Permission to leave phone message Yes     Patient questions:  Do you have a fever, pain , or abdominal swelling? No. Pain Score  0 *  Have you tolerated food without any problems? Yes.    Have you been able to return to your normal activities? Yes.    Do you have any questions about your discharge instructions: Diet   No. Medications  No. Follow up visit  No.  Do you have questions or concerns about your Care? No.  Actions: * If pain score is 4 or above: No action needed, pain <4.  No problems per the pt. maw

## 2014-11-24 ENCOUNTER — Encounter: Payer: Self-pay | Admitting: Gastroenterology

## 2014-11-29 ENCOUNTER — Ambulatory Visit (INDEPENDENT_AMBULATORY_CARE_PROVIDER_SITE_OTHER): Payer: BLUE CROSS/BLUE SHIELD | Admitting: Internal Medicine

## 2014-11-29 ENCOUNTER — Encounter: Payer: Self-pay | Admitting: Internal Medicine

## 2014-11-29 VITALS — BP 132/90 | HR 74 | Temp 98.4°F | Resp 20 | Ht 69.75 in | Wt 160.0 lb

## 2014-11-29 DIAGNOSIS — M7501 Adhesive capsulitis of right shoulder: Secondary | ICD-10-CM

## 2014-11-29 NOTE — Progress Notes (Signed)
Pre visit review using our clinic review tool, if applicable. No additional management support is needed unless otherwise documented below in the visit note. 

## 2014-11-29 NOTE — Patient Instructions (Addendum)
Adhesive Capsulitis Sometimes the shoulder becomes stiff and is painful to move. Some people say it feels as if the shoulder is frozen in place. Because of this, the condition is called "frozen shoulder." Its medical name is adhesive capsulitis.  The shoulder joint is made up of strong connective tissue that attaches the ball of the humerus to the shallow shoulder socket. This strong connective tissue is called the joint capsule. This tissue can become stiff and swollen. That is when adhesive capsulitis sets in. CAUSES  It is not always clear just what the cause adhesive capsulitis. Possibilities include:  Injury to the shoulder joint.  Strain. This is a repetitive injury brought about by overuse.  Lack of use. Perhaps your arm or hand was otherwise injured. It might have been in a sling for awhile. Or perhaps you were not using it to avoid pain.  Referred pain. This is a sort of trick the body plays. You feel pain in the shoulder. But, the pain actually comes from an injury somewhere else in the body.  Long-standing health problems. Several diseases can cause adhesive capsulitis. They include diabetes, heart disease, stroke, thyroid problems, rheumatoid arthritis and lung disease.  Being a women older than 53. Anyone can develop adhesive capsulitis but it is most common in women in this age group. SYMPTOMS   Pain.  It occurs when the arm is moved.  Parts of the shoulder might hurt if they are touched.  Pain is worse at night or when resting.  Soreness. It might not be strong enough to be called pain. But, the shoulder aches.  The shoulder does not move freely.  Muscle spasms.  Trouble sleeping because of shoulder ache or pain. DIAGNOSIS  To decide if you have adhesive capsulitis, your healthcare provider will probably:  Ask about symptoms you have noticed.  Ask about your history of joint pain and anything that might have caused the pain.  Ask about your overall  health.  Use hands to feel your shoulder and neck.  Ask you to move your shoulder in specific directions. This may indicate the origin of the pain.  Order imaging tests; pictures of the shoulder. They help pinpoint the source of the problem. An X-ray might be used. For more detail, an MRI is often used. An MRI details the tendons, muscles and ligaments as well as the joint. TREATMENT  Adhesive capsulitis can be treated several ways. Most treatments can be done in a clinic or in your healthcare provider's office. Be sure to discuss the different options with your caregiver. They include:  Physical therapy. You will work on specific exercises to get your shoulder moving again. The exercises usually involve stretching. A physical therapist (a caregiver with special training) can show you what to do and what not to do. The exercises will need to be done daily.  Medication.  Over-the-counter medicines may relieve pain and inflammation (the body's way of reacting to injury or infection).  Corticosteroids. These are stronger drugs to reduce pain and inflammation. They are given by injection (shots) into the shoulder joint. Frequent treatment is not recommended.  Muscle relaxants. Medication may be prescribed to ease muscle spasms.  Treatment of underlying conditions. This means treating another condition that is causing your shoulder problem. This might be a rotator cuff (tendon) problem  Shoulder manipulation. The shoulder will be moved by your healthcare provider. You would be under general anesthesia (given a drug that puts you to sleep). You would not feel anything. Sometimes  the joint will be injected with salt water (saline) at high pressure to break down internal scarring in the joint capsule.  Surgery. This is rarely needed. It may be suggested in advanced cases after all other treatment has failed. PROGNOSIS  In time, most people recover from adhesive capsulitis. Sometimes, however, the  pain goes away but full movement of the shoulder does not return.  HOME CARE INSTRUCTIONS   Take any pain medications recommended by your healthcare provider. Follow the directions carefully.  If you have physical therapy, follow through with the therapist's suggestions. Be sure you understand the exercises you will be doing. You should understand:  How often the exercises should be done.  How many times each exercise should be repeated.  How long they should be done.  What other activities you should do, or not do.  That you should warm up before doing any exercise. Just 5 to 10 minutes will help. Small, gentle movements should get your shoulder ready for more.  Avoid high-demand exercise that involves your shoulder such as throwing. This type of exercise can make pain worse.  Consider using cold packs. Cold may ease swelling and pain. Ask your healthcare provider if a cold pack might help you. If so, get directions on how and when to use them. SEEK MEDICAL CARE IF:   You have any questions about your medications.  Your pain continues to increase. Document Released: 09/02/2009 Document Revised: 01/28/2012 Document Reviewed: 09/02/2009 [facility] Patient Information 2015 [place], [place]. This information is not intended to replace advice given to you by your health care provider. Make sure you discuss any questions you have with your health care provider.   Call for orthopedic referral if no improvement

## 2014-11-29 NOTE — Progress Notes (Signed)
Subjective:    Patient ID: Derrick Villegas, male    DOB: [birth year], 54 y.o.   MRN: [id number]  HPI  54 year old patient who presents with a chief complaint of worsening right shoulder pain and decreased range of motion.  No history of trauma.  Symptoms started in early November and progressed over 3 weeks' time.  Symptoms have been very constant until about 1 week ago when pain and range of motion deficits Intensified.  Past Medical History  Diagnosis Date  . ANXIETY 08/29/2007  . COLONIC POLYPS, HX OF 03/24/2010  . DEPRESSION 08/22/2007  . Inflamed seborrheic keratosis 04/11/2010    History   Social History  . Marital Status: Single    Spouse Name: N/A    Number of Children: N/A  . Years of Education: N/A   Occupational History  . Not on file.   Social History Main Topics  . Smoking status: Former Smoker    Quit date: 11/19/1993  . Smokeless tobacco: Never Used  . Alcohol Use: No  . Drug Use: No  . Sexual Activity: Not on file   Other Topics Concern  . Not on file   Social History Narrative    Past Surgical History  Procedure Laterality Date  . Coccyx removal    . Colonoscopy    . Polypectomy      Family History  Problem Relation Age of Onset  . Thyroid disease Mother   . Hypertension Father   . Kidney disease Father   . Pancreatic cancer Father   . Prostate cancer Father   . Hypertension Brother   . Cancer Brother     Colon cancer  . Colon cancer Brother 73  . Esophageal cancer Neg Hx   . Rectal cancer Neg Hx   . Stomach cancer Neg Hx     Allergies  Allergen Reactions  . Paroxetine     REACTION: nausea, tingling in arms    Current Outpatient Prescriptions on File Prior to Visit  Medication Sig Dispense Refill  . ALPRAZolam (XANAX) 0.5 MG tablet take 1 tablet by mouth three times a day if needed for sleep 60 tablet 5  . aspirin 81 MG tablet Take 81 mg by mouth daily.    . fluticasone (FLONASE) 50 MCG/ACT nasal spray Place 2 sprays into both  nostrils daily. 16 g 6  . Multiple Vitamin (MULTIVITAMIN) tablet Take 1 tablet by mouth daily.    . Omega-3 Fatty Acids (FISH OIL) 1000 MG CAPS Take 1 capsule by mouth daily.     No current facility-administered medications on file prior to visit.    BP 132/90 mmHg  Pulse 74  Temp(Src) 98.4 F (36.9 C) (Oral)  Resp 20  Ht 5' 9.75" (1.772 m)  Wt 160 lb (72.576 kg)  BMI 23.11 kg/m2  SpO2 98%      Review of Systems  Constitutional: Negative for fever, chills, appetite change and fatigue.  HENT: Negative for congestion, dental problem, ear pain, hearing loss, sore throat, tinnitus, trouble swallowing and voice change.   Eyes: Negative for pain, discharge and visual disturbance.  Respiratory: Negative for cough, chest tightness, wheezing and stridor.   Cardiovascular: Negative for chest pain, palpitations and leg swelling.  Gastrointestinal: Negative for nausea, vomiting, abdominal pain, diarrhea, constipation, blood in stool and abdominal distention.  Genitourinary: Negative for urgency, hematuria, flank pain, discharge, difficulty urinating and genital sores.  Musculoskeletal: Negative for myalgias, back pain, joint swelling, arthralgias, gait problem and neck stiffness.  Right shoulder pain Some pain and stiffness involving his right thumb  Skin: Negative for rash.  Neurological: Negative for dizziness, syncope, speech difficulty, weakness, numbness and headaches.  Hematological: Negative for adenopathy. Does not bruise/bleed easily.  Psychiatric/Behavioral: Negative for behavioral problems and dysphoric mood. The patient is not nervous/anxious.        Objective:   Physical Exam  Constitutional: He appears well-developed and well-nourished. No distress.  Musculoskeletal:  Full abduction of the right arm with pain at 90 Unable to perform internal rotation lag test due to inability to place his right arm behind his back External rotation resistance test mildly  positive Pain and decreased range of motion with external rotation lag test Negative drop arm test          Assessment & Plan:  Adhesive capsulitis.  Will set up for physical therapy.  He was told to take Aleve prior to each session.  If unimproved, will set up to see orthopedics for consideration of cortisone injection and further management

## 2014-12-08 ENCOUNTER — Ambulatory Visit (INDEPENDENT_AMBULATORY_CARE_PROVIDER_SITE_OTHER): Payer: BLUE CROSS/BLUE SHIELD | Admitting: Physical Therapy

## 2014-12-08 DIAGNOSIS — M7501 Adhesive capsulitis of right shoulder: Secondary | ICD-10-CM

## 2014-12-08 DIAGNOSIS — M25619 Stiffness of unspecified shoulder, not elsewhere classified: Secondary | ICD-10-CM

## 2014-12-08 DIAGNOSIS — M25519 Pain in unspecified shoulder: Secondary | ICD-10-CM

## 2014-12-14 ENCOUNTER — Encounter (INDEPENDENT_AMBULATORY_CARE_PROVIDER_SITE_OTHER): Payer: BLUE CROSS/BLUE SHIELD | Admitting: Physical Therapy

## 2014-12-14 DIAGNOSIS — M7501 Adhesive capsulitis of right shoulder: Secondary | ICD-10-CM

## 2014-12-14 DIAGNOSIS — M25619 Stiffness of unspecified shoulder, not elsewhere classified: Secondary | ICD-10-CM

## 2014-12-14 DIAGNOSIS — M25519 Pain in unspecified shoulder: Secondary | ICD-10-CM

## 2014-12-17 ENCOUNTER — Encounter (INDEPENDENT_AMBULATORY_CARE_PROVIDER_SITE_OTHER): Payer: BLUE CROSS/BLUE SHIELD | Admitting: Physical Therapy

## 2014-12-17 DIAGNOSIS — M25519 Pain in unspecified shoulder: Secondary | ICD-10-CM

## 2014-12-17 DIAGNOSIS — M25619 Stiffness of unspecified shoulder, not elsewhere classified: Secondary | ICD-10-CM

## 2014-12-17 DIAGNOSIS — M7501 Adhesive capsulitis of right shoulder: Secondary | ICD-10-CM

## 2014-12-21 ENCOUNTER — Encounter (INDEPENDENT_AMBULATORY_CARE_PROVIDER_SITE_OTHER): Payer: BLUE CROSS/BLUE SHIELD | Admitting: Physical Therapy

## 2014-12-21 DIAGNOSIS — M25619 Stiffness of unspecified shoulder, not elsewhere classified: Secondary | ICD-10-CM

## 2014-12-21 DIAGNOSIS — M25519 Pain in unspecified shoulder: Secondary | ICD-10-CM

## 2014-12-21 DIAGNOSIS — M7501 Adhesive capsulitis of right shoulder: Secondary | ICD-10-CM

## 2014-12-23 ENCOUNTER — Encounter (INDEPENDENT_AMBULATORY_CARE_PROVIDER_SITE_OTHER): Payer: BLUE CROSS/BLUE SHIELD | Admitting: Physical Therapy

## 2014-12-23 DIAGNOSIS — M7501 Adhesive capsulitis of right shoulder: Secondary | ICD-10-CM

## 2014-12-23 DIAGNOSIS — M25619 Stiffness of unspecified shoulder, not elsewhere classified: Secondary | ICD-10-CM

## 2014-12-23 DIAGNOSIS — M25519 Pain in unspecified shoulder: Secondary | ICD-10-CM

## 2014-12-27 ENCOUNTER — Encounter (INDEPENDENT_AMBULATORY_CARE_PROVIDER_SITE_OTHER): Payer: BLUE CROSS/BLUE SHIELD | Admitting: Physical Therapy

## 2014-12-27 DIAGNOSIS — M7501 Adhesive capsulitis of right shoulder: Secondary | ICD-10-CM

## 2014-12-27 DIAGNOSIS — M25619 Stiffness of unspecified shoulder, not elsewhere classified: Secondary | ICD-10-CM

## 2014-12-27 DIAGNOSIS — M25519 Pain in unspecified shoulder: Secondary | ICD-10-CM

## 2014-12-28 ENCOUNTER — Encounter: Payer: BLUE CROSS/BLUE SHIELD | Admitting: Physical Therapy

## 2014-12-30 ENCOUNTER — Encounter (INDEPENDENT_AMBULATORY_CARE_PROVIDER_SITE_OTHER): Payer: BLUE CROSS/BLUE SHIELD | Admitting: Physical Therapy

## 2014-12-30 DIAGNOSIS — M25619 Stiffness of unspecified shoulder, not elsewhere classified: Secondary | ICD-10-CM

## 2014-12-30 DIAGNOSIS — M25519 Pain in unspecified shoulder: Secondary | ICD-10-CM

## 2014-12-30 DIAGNOSIS — M7501 Adhesive capsulitis of right shoulder: Secondary | ICD-10-CM

## 2015-01-04 ENCOUNTER — Ambulatory Visit (INDEPENDENT_AMBULATORY_CARE_PROVIDER_SITE_OTHER): Payer: BLUE CROSS/BLUE SHIELD | Admitting: Physical Therapy

## 2015-01-04 ENCOUNTER — Encounter (INDEPENDENT_AMBULATORY_CARE_PROVIDER_SITE_OTHER): Payer: Self-pay

## 2015-01-04 DIAGNOSIS — M25611 Stiffness of right shoulder, not elsewhere classified: Secondary | ICD-10-CM

## 2015-01-04 DIAGNOSIS — M25511 Pain in right shoulder: Secondary | ICD-10-CM

## 2015-01-04 NOTE — Therapy (Signed)
[place], [place], [address] Phone: [phone]   Fax:  [phone]  Physical Therapy Treatment  Patient Details  Name: JOHNMARK GEIGER MRN: [id number] Date of Birth: 1961-03-21 Referring Provider:  Marletta Lor, MD  Encounter Date: 01/04/2015      PT End of Session - 01/04/15 1018    PT Start Time 0930   PT Stop Time 1019   PT Time Calculation (min) 49 min   Equipment Utilized During Treatment --  Vasopeumatic    Activity Tolerance Patient limited by pain   Behavior During Therapy Anxious  gets anxious with pain       Past Medical History  Diagnosis Date  . ANXIETY 08/29/2007  . COLONIC POLYPS, HX OF 03/24/2010  . DEPRESSION 08/22/2007  . Inflamed seborrheic keratosis 04/11/2010    Past Surgical History  Procedure Laterality Date  . Coccyx removal    . Colonoscopy    . Polypectomy      There were no vitals taken for this visit.  Visit Diagnosis:  Stiffness of shoulder joint, right  Right shoulder pain      Subjective Assessment - 01/04/15 0934    Symptoms still hurts when sleeping, certain movements hurt more when reaching outward and above head   Limitations --  sleeping, working,    Currently in Pain? Yes   Pain Score --  1-2/10 at rest, 7-8/10 above his limit with reaching up and outward   Pain Location Shoulder   Pain Orientation Right   Pain Descriptors / Indicators Dull;Sharp   Pain Type Chronic pain   Pain Onset More than a month ago   Pain Frequency Intermittent   Pain Relieving Factors NSAIDS, heat, ice   Effect of Pain on Daily Activities sleeping and work   Multiple Pain Sites No          [facility] PT Assessment - 01/04/15 0001    AROM   Right Shoulder Flexion 130 Degrees   Right Shoulder ABduction 90 Degrees   Right Shoulder Internal Rotation 35 Degrees   Right Shoulder External Rotation 50 Degrees   PROM   Right Shoulder Flexion 133 Degrees   Right Shoulder ABduction 91 Degrees   Right Shoulder Internal Rotation 36 Degrees   Right Shoulder External Rotation 60 Degrees                  [facility] Adult PT Treatment/Exercise - 01/04/15 0001    Exercises   Exercises Shoulder   Shoulder Exercises: Standing   Internal Rotation 5 reps  stretch at door with holding and stand up with good posture   Shoulder Exercises: Pulleys   Other Pulley Exercises 5 minutes with 2.5 min flexion and 2.5 min. with scaption   Shoulder Exercises: ROM/Strengthening   UBE (Upper Arm Bike) level 2, 2.5 min forward, 2.5 min. backward.   Shoulder Exercises: Stretch   Other Shoulder Stretches finger ladder #27 stretch   Modalities   Modalities Electrical Stimulation;Cryotherapy  vasopeumatic   Manual Therapy   Manual Therapy Myofascial release;Passive ROM;Joint mobilization  GH mobs Grd 3 and 4 then 2 for pain decrease   Myofascial Release right pect, subscap, peri scapular  right pects, subscap, periscapular   Passive ROM right shoulder flexion, ER, IR, ABd.                  PT Short Term Goals - 01/04/15 1024    PT SHORT TERM GOAL #1  Title be independent iwth initail HEP   PT SHORT TERM GOAL #2   Title report pain decrease by 50% with shoulder movement   PT SHORT TERM GOAL #3   Title demo imporved right shoulder flex/abd to 130 degrees or better to improve function   PT SHORT TERM GOAL #4   Title reach behind his back to waist level or better           PT Long Term Goals - 01/04/15 1026    PT LONG TERM GOAL #1   Title demostrate and/or verbalize techiniques to reduce the risk of re-injury to include info on physical activity   PT LONG TERM GOAL #2   Title be independent with advanced HEP   PT LONG TERM GOAL #3   Title demo improed shoulder flex/abd to 150 degrees to normalize ADLS   PT LONG TERM GOAL #4   Title improve FOTO =/<31% limited   PT LONG TERM GOAL #5   Title demo imporved IR and ER to [facility] to allow  normal dressing/bathing               Plan - 01/04/15 1022    Clinical Impression Statement patient says he is getting better with ROM and dong more at home for exercise and ROM. ROM has increased for all motions   Rehab Potential Good   PT Frequency 2x / week   PT Duration 8 weeks   PT Treatment/Interventions Moist Heat;Therapeutic exercise;Passive range of motion;Ultrasound;Manual techniques;Cryotherapy;Electrical Stimulation   PT Next Visit Plan ROM         Problem List Patient Active Problem List   Diagnosis Date Noted  . Trigger thumb of right hand 03/10/2014  . Thumb tendonitis 02/27/2014  . FH: colon cancer 06/19/2013  . INFLAMED SEBORRHEIC KERATOSIS 04/11/2010  . COLONIC POLYPS, HX OF 03/24/2010  . ANXIETY 08/29/2007  . DEPRESSION 08/22/2007    Natividad Brood, PTA 01/04/2015, 10:45 AM  [facility] [place], [place], [address] Phone: [phone]   Fax:  [phone]

## 2015-01-04 NOTE — Patient Instructions (Signed)
Shoulder [place] 4  Shoulder supine cane

## 2015-01-06 ENCOUNTER — Ambulatory Visit (INDEPENDENT_AMBULATORY_CARE_PROVIDER_SITE_OTHER): Payer: BLUE CROSS/BLUE SHIELD | Admitting: Physical Therapy

## 2015-01-06 ENCOUNTER — Encounter: Payer: Self-pay | Admitting: Physical Therapy

## 2015-01-06 DIAGNOSIS — M25611 Stiffness of right shoulder, not elsewhere classified: Secondary | ICD-10-CM

## 2015-01-06 DIAGNOSIS — M25511 Pain in right shoulder: Secondary | ICD-10-CM

## 2015-01-06 NOTE — Patient Instructions (Addendum)
Strengthening: Wall Push-Up   With arms slightly wider apart than shoulder width, gently lean body toward wall. Repeat __10__ times per set. Do _1-2___ sets per session. Do __1__ sessions per day.  http://orth.exer.us/904   Copyright  VHI. All rights reserved.  ROM: Towel Stretch - with Interior Rotation   Pull left arm up behind back by pulling towel up with other arm. Hold 10-30____ seconds. Repeat _4___ times per set. Do _1___ sets per session. Do ___1_ sessions per day.  http://orth.exer.us/888   Copyright  VHI. All rights reserved.

## 2015-01-06 NOTE — Therapy (Signed)
[place], [place], [address] Phone: [phone]   Fax:  [phone]  Physical Therapy Treatment  Patient Details  Name: Derrick Villegas MRN: [id number] Date of Birth: 06/26/1961 Referring Provider:  Marletta Lor, MD  Encounter Date: 01/06/2015      PT End of Session - 01/06/15 0854    Visit Number 9   Number of Visits 16   Date for PT Re-Evaluation 02/02/15   PT Start Time 1660   PT Stop Time 0944   PT Time Calculation (min) 57 min      Past Medical History  Diagnosis Date  . ANXIETY 08/29/2007  . COLONIC POLYPS, HX OF 03/24/2010  . DEPRESSION 08/22/2007  . Inflamed seborrheic keratosis 04/11/2010    Past Surgical History  Procedure Laterality Date  . Coccyx removal    . Colonoscopy    . Polypectomy      There were no vitals taken for this visit.  Visit Diagnosis:  Stiffness of shoulder joint, right  Pain in joint, shoulder region, right      Subjective Assessment - 01/06/15 0851    Symptoms "Very sore today.  Pain at rest now 2-3/10 instead of 0-1/10. Think I may have pushed it too hard.  I'm thinking of asking doctor for medicine to take at night since I'm having trouble sleeping this week.    Currently in Pain? Yes   Pain Score 3   with movement, increases to 7/10   Pain Location Shoulder   Pain Orientation Right   Pain Descriptors / Indicators Dull;Aching   Aggravating Factors  moving Rt arm    Pain Relieving Factors heat, ice, rest           [facility] Adult PT Treatment/Exercise - 01/06/15 0001    Shoulder Exercises: Standing   External Rotation Strengthening;Right;10 reps;Theraband   Theraband Level (Shoulder External Rotation) Level 3 (Green)   Internal Rotation Strengthening;Right;10 reps;Theraband   Theraband Level (Shoulder Internal Rotation) Level 3 (Green)   Flexion Strengthening;Right;10 reps;Theraband   Theraband Level (Shoulder Flexion) Level 3 (Green)   Extension Strengthening;Right;10 reps;Theraband   Theraband Level (Shoulder Extension) Level 3 (Green)   Row Strengthening;Both;20 reps;Theraband   Theraband Level (Shoulder Row) Level 3 (Green)   Other Standing Exercises Rings on Arch (11) R<>L x 1 trial. Use of 4# pendulum between reps  Flexion to ~96 degrees at highest part of arch.   Shoulder Exercises: Pulleys   Other Pulley Exercises 5 min (3 min flex/2 min Scaption)   Shoulder Exercises: ROM/Strengthening   UBE (Upper Arm Bike) Level 2, 2 min each direction    Wall Pushups 5 reps   Modified Plank 4 reps  hands on high/low table - weight shifts for 10-15 secs   Shoulder Exercises: Stretch   Corner Stretch 3 reps;20 seconds   Internal Rotation Stretch 10 seconds  4 reps, LUE assisting RUE behind back   Other Shoulder Stretches Finger ladder, 2 reps flex to #29, 2 reps scaption to #27   Cryotherapy   Number Minutes Cryotherapy 15 Minutes   Cryotherapy Location Shoulder  R   Type of Cryotherapy Other (comment)  VASO   Electrical Stimulation   Electrical Stimulation Location --  R shoulder    Electrical Stimulation Action IFC   Electrical Stimulation Parameters 80/150 Hz, 15 min    Electrical Stimulation Goals Pain          PT Education - 01/06/15 0941    Education  provided Yes   Education Details HEP (additional exercises - wall push up and IR stretch).  Also instructed pt to only perform strengthening exercises 1x/day or every other day.    Person(s) Educated Patient   Methods Explanation;Demonstration   Comprehension Verbalized understanding;Returned demonstration          PT Short Term Goals - 01/06/15 0933    PT SHORT TERM GOAL #1   Status Achieved   PT SHORT TERM GOAL #2   Status Achieved   PT SHORT TERM GOAL #3   Status Partially Met  Flexion to 130 degrees, still lacking in ABD   PT SHORT TERM GOAL #4   Status On-going           PT Long Term Goals - 01/04/15 1026    PT LONG TERM GOAL #1    Title demostrate and/or verbalize techiniques to reduce the risk of re-injury to include info on physical activity   PT LONG TERM GOAL #2   Title be independent with advanced HEP   PT LONG TERM GOAL #3   Title demo improed shoulder flex/abd to 150 degrees to normalize ADLS   PT LONG TERM GOAL #4   Title improve FOTO =/<31% limited   PT LONG TERM GOAL #5   Title demo imporved IR and ER to [facility] to allow normal dressing/bathing         Plan - 01/06/15 0949    Clinical Impression Statement Pt has met STG 1-2, partially met STG #3.  Pt making good progress towards goals.  Pt tolerated new exercises this session with minimal increase in pain.     PT Treatment/Interventions Cryotherapy;Electrical Stimulation;Therapeutic exercise   PT Next Visit Plan Incorporate reaching activities to simulate taking items out of cabinet (pt goal).  Continue per POC.    Consulted and Agree with Plan of Care Patient       Problem List Patient Active Problem List   Diagnosis Date Noted  . Trigger thumb of right hand 03/10/2014  . Thumb tendonitis 02/27/2014  . FH: colon cancer 06/19/2013  . INFLAMED SEBORRHEIC KERATOSIS 04/11/2010  . COLONIC POLYPS, HX OF 03/24/2010  . ANXIETY 08/29/2007  . DEPRESSION 08/22/2007    Kerin Perna, PTA 01/06/2015 9:52 AM  [facility] [place], [place], [address] Phone: [phone]   Fax:  [phone]

## 2015-01-10 ENCOUNTER — Ambulatory Visit (INDEPENDENT_AMBULATORY_CARE_PROVIDER_SITE_OTHER): Payer: BLUE CROSS/BLUE SHIELD | Admitting: Physical Therapy

## 2015-01-10 DIAGNOSIS — M25611 Stiffness of right shoulder, not elsewhere classified: Secondary | ICD-10-CM

## 2015-01-10 DIAGNOSIS — M25511 Pain in right shoulder: Secondary | ICD-10-CM

## 2015-01-10 NOTE — Therapy (Signed)
[place], [place], [address] Phone: [phone]   Fax:  [phone]  Physical Therapy Treatment  Patient Details  Name: Derrick Villegas MRN: [id number] Date of Birth: 1961-02-03 Referring Provider:  Marletta Lor, MD  Encounter Date: 01/10/2015      PT End of Session - 01/10/15 1224    Visit Number 10   Number of Visits 16   Date for PT Re-Evaluation 02/02/15   PT Start Time 0998   PT Stop Time 1240   PT Time Calculation (min) 55 min      Past Medical History  Diagnosis Date  . ANXIETY 08/29/2007  . COLONIC POLYPS, HX OF 03/24/2010  . DEPRESSION 08/22/2007  . Inflamed seborrheic keratosis 04/11/2010    Past Surgical History  Procedure Laterality Date  . Coccyx removal    . Colonoscopy    . Polypectomy      There were no vitals taken for this visit.  Visit Diagnosis:  Stiffness of shoulder joint, right  Pain in joint, shoulder region, right  Right shoulder pain      Subjective Assessment - 01/10/15 1153    Symptoms doing very well and getting better.   Currently in Pain? Yes  5-6/10   Pain Location Shoulder   Pain Orientation Right   Aggravating Factors  ER and reaching  going out to abd and ER   Pain Relieving Factors shoulder rolls   Effect of Pain on Daily Activities improving much better.           [facility] PT Assessment - 01/10/15 0001    AROM   Right Shoulder Flexion 137 Degrees   Right Shoulder ABduction 90 Degrees   Right Shoulder Internal Rotation 70 Degrees   Right Shoulder External Rotation 60 Degrees                  [facility] Adult PT Treatment/Exercise - 01/10/15 0001    Shoulder Exercises: Seated   Other Seated Exercises --  pulley 3 min flexion and scaption   Shoulder Exercises: Standing   External Rotation 15 reps  green band 3 sets   External Rotation Limitations --  green band 3 sets   Internal Rotation 15 reps  3 sets with green  band   Flexion 15 reps  green band 3 sets.   Theraband Level (Shoulder Retraction) Level 3 (Green)  3 sets of 10 with slow movements   Modalities   Modalities Cryotherapy  vaso X 15 med. pressure                  PT Short Term Goals - 01/06/15 0933    PT SHORT TERM GOAL #1   Status Achieved   PT SHORT TERM GOAL #2   Status Achieved   PT SHORT TERM GOAL #3   Status Partially Met  Flexion to 130 degrees, still lacking in ABD   PT SHORT TERM GOAL #4   Status On-going           PT Long Term Goals - 01/04/15 1026    PT LONG TERM GOAL #1   Title demostrate and/or verbalize techiniques to reduce the risk of re-injury to include info on physical activity   PT LONG TERM GOAL #2   Title be independent with advanced HEP   PT LONG TERM GOAL #3   Title demo improed shoulder flex/abd to 150 degrees to normalize ADLS   PT LONG TERM GOAL #4  Title improve FOTO =/<31% limited   PT LONG TERM GOAL #5   Title demo imporved IR and ER to [facility] to allow normal dressing/bathing               Plan - 01/10/15 1227    Clinical Impression Statement patient is doing much better and working much harder at home. He is able to dishes out of cabinet with no pain.   Rehab Potential Good   PT Frequency 2x / week   PT Duration 8 weeks   PT Next Visit Plan continue with ROM focus on ABd and ER, flexion and IR is progressing well        Problem List Patient Active Problem List   Diagnosis Date Noted  . Trigger thumb of right hand 03/10/2014  . Thumb tendonitis 02/27/2014  . FH: colon cancer 06/19/2013  . INFLAMED SEBORRHEIC KERATOSIS 04/11/2010  . COLONIC POLYPS, HX OF 03/24/2010  . ANXIETY 08/29/2007  . DEPRESSION 08/22/2007    Natividad Brood, PTA  01/10/2015, 12:33 PM  [facility] [place], [place], [address] Phone: [phone]   Fax:  [phone]

## 2015-01-14 ENCOUNTER — Ambulatory Visit (INDEPENDENT_AMBULATORY_CARE_PROVIDER_SITE_OTHER): Payer: BLUE CROSS/BLUE SHIELD | Admitting: Physical Therapy

## 2015-01-14 DIAGNOSIS — M25511 Pain in right shoulder: Secondary | ICD-10-CM

## 2015-01-14 DIAGNOSIS — M25611 Stiffness of right shoulder, not elsewhere classified: Secondary | ICD-10-CM

## 2015-01-14 NOTE — Therapy (Signed)
[place], [place], [address] Phone: [phone]   Fax:  [phone]  Physical Therapy Treatment  Patient Details  Name: Derrick Villegas MRN: [id number] Date of Birth: July 30, 1961 Referring Provider:  Marletta Lor, MD  Encounter Date: 01/14/2015      PT End of Session - 01/14/15 1204    Visit Number 11   Number of Visits 16   Date for PT Re-Evaluation 02/02/15   PT Start Time 9983   PT Stop Time 3825   PT Time Calculation (min) 50 min   Activity Tolerance Patient tolerated treatment well      Past Medical History  Diagnosis Date  . ANXIETY 08/29/2007  . COLONIC POLYPS, HX OF 03/24/2010  . DEPRESSION 08/22/2007  . Inflamed seborrheic keratosis 04/11/2010    Past Surgical History  Procedure Laterality Date  . Coccyx removal    . Colonoscopy    . Polypectomy      There were no vitals taken for this visit.  Visit Diagnosis:  Stiffness of shoulder joint, right  Pain in joint, shoulder region, right  Right shoulder pain      Subjective Assessment - 01/14/15 1147    Symptoms Doing better; can reach and get things off top shelf now!    Currently in Pain? No/denies          [facility] PT Assessment - 01/14/15 0001    AROM   Right/Left Shoulder Right   Right Shoulder Flexion 147 Degrees  degrees, standing    Right Shoulder ABduction 110 Degrees  degrees, standing   Right Shoulder Internal Rotation 45 Degrees  supine with 90 degrees abd    Right Shoulder External Rotation 70 Degrees            [facility] Adult PT Treatment/Exercise - 01/14/15 0001    Exercises   Exercises Shoulder   Shoulder Exercises: Standing   Flexion Strengthening;Right;10 reps   Shoulder Flexion Weight (lbs) 3# x 10, 5# x 10   Lifting wt off eye level shelf, with controlled descent.   Other Standing Exercises Wall ladder, flex x 4 to #30, abd x 4 up to #27   Other Standing Exercises Rings on Arch (11)  R<>L x 2 trials, increased height without discomfort.  Wall push ups x 10,  Modified plank with hands on mat table -side to side hand taps x 15    Shoulder Exercises: Pulleys   Flexion 3 minutes   ABduction 2 minutes   Shoulder Exercises: ROM/Strengthening   UBE (Upper Arm Bike) level 3 standing, 2 min each way   Shoulder Exercises: Stretch   Internal Rotation Stretch 10 seconds   Internal Rotation Stretch Limitations difficult. able to use LUE behind back, tried towel (painful)    External Rotation Stretch 2 reps;60 seconds  1# wt in hand, pt in supine   Cryotherapy   Number Minutes Cryotherapy 15 Minutes   Cryotherapy Location Shoulder  R   Type of Cryotherapy --  VASO, 3 snowflakes, med pressure                PT Education - 01/14/15 1257    Education provided Yes   Education Details HEP - added ER low load prolonged stretch in supine.    Person(s) Educated Patient   Methods Demonstration;Explanation   Comprehension Verbalized understanding;Returned demonstration          PT Short Term Goals - 01/14/15 1233    PT SHORT TERM  GOAL #1   Title be independent iwth initail HEP   Time 4   Period Weeks   Status Achieved   PT SHORT TERM GOAL #2   Title report pain decrease by 50% with shoulder movement   Time 4   Period Weeks   Status Achieved   PT SHORT TERM GOAL #3   Title demo imporved right shoulder flex/abd to 130 degrees or better to improve function   Time 4   Status Partially Met   PT SHORT TERM GOAL #4   Title reach behind his back to waist level or better   Time 4   Period Weeks   Status Achieved           PT Long Term Goals - 01/04/15 1026    PT LONG TERM GOAL #1   Title demostrate and/or verbalize techiniques to reduce the risk of re-injury to include info on physical activity   PT LONG TERM GOAL #2   Title be independent with advanced HEP   PT LONG TERM GOAL #3   Title demo improed shoulder flex/abd to 150 degrees to normalize ADLS   PT  LONG TERM GOAL #4   Title improve FOTO =/<31% limited   PT LONG TERM GOAL #5   Title demo imporved IR and ER to [facility] to allow normal dressing/bathing               Plan - 01/14/15 1226    Clinical Impression Statement Pt demonstrated improved strength and ROM in Rt shoulder, reporting less pain with increased motion. Pt is making good progress towards all goals; has met STG #4.  Scored 35% limited on FOTO survey (goal is 31%)   Rehab Potential Good   PT Frequency 2x / week   PT Duration 8 weeks   PT Treatment/Interventions Cryotherapy;Electrical Stimulation;Therapeutic exercise;Ultrasound;Moist Heat;Manual techniques;Patient/family education   PT Next Visit Plan continue with ROM focus on ABd and ER, flexion and IR is progressing well. Advance HEP.    Consulted and Agree with Plan of Care Patient        Problem List Patient Active Problem List   Diagnosis Date Noted  . Trigger thumb of right hand 03/10/2014  . Thumb tendonitis 02/27/2014  . FH: colon cancer 06/19/2013  . INFLAMED SEBORRHEIC KERATOSIS 04/11/2010  . COLONIC POLYPS, HX OF 03/24/2010  . ANXIETY 08/29/2007  . DEPRESSION 08/22/2007   Kerin Perna, PTA 01/14/2015 1:01 PM  [facility] [place], [place], [address] Phone: [phone]   Fax:  [phone]

## 2015-01-17 ENCOUNTER — Ambulatory Visit (INDEPENDENT_AMBULATORY_CARE_PROVIDER_SITE_OTHER): Payer: BLUE CROSS/BLUE SHIELD | Admitting: Physical Therapy

## 2015-01-17 DIAGNOSIS — M25511 Pain in right shoulder: Secondary | ICD-10-CM

## 2015-01-17 DIAGNOSIS — M25611 Stiffness of right shoulder, not elsewhere classified: Secondary | ICD-10-CM

## 2015-01-17 NOTE — Therapy (Signed)
[place], [place], [address] Phone: [phone]   Fax:  [phone]  Physical Therapy Treatment  Patient Details  Name: Derrick Villegas MRN: [id number] Date of Birth: 07/28/1961 Referring Provider:  Marletta Lor, MD  Encounter Date: 01/17/2015      PT End of Session - 01/17/15 1102    Visit Number 12   Number of Visits 16   Date for PT Re-Evaluation 02/02/15   PT Start Time 1100   PT Stop Time 1158   PT Time Calculation (min) 58 min      Past Medical History  Diagnosis Date  . ANXIETY 08/29/2007  . COLONIC POLYPS, HX OF 03/24/2010  . DEPRESSION 08/22/2007  . Inflamed seborrheic keratosis 04/11/2010    Past Surgical History  Procedure Laterality Date  . Coccyx removal    . Colonoscopy    . Polypectomy      There were no vitals taken for this visit.  Visit Diagnosis:  Stiffness of shoulder joint, right  Pain in joint, shoulder region, right  Right shoulder pain      Subjective Assessment - 01/17/15 1103    Symptoms I woke with pain, but it went away when I did the shoulder rolls and stretches. Things are going really well.    Currently in Pain? No/denies          [facility] PT Assessment - 01/17/15 0001    ROM / Strength   AROM / PROM / Strength AROM;Strength   AROM   Right/Left Shoulder Right   Right Shoulder Flexion 152 Degrees  degrees, seated    Right Shoulder ABduction 120 Degrees  degrees, seated    Right Shoulder Internal Rotation 65 Degrees  degrees, supine    Right Shoulder External Rotation 60 Degrees  degrees, supine    Strength   Strength Assessment Site Shoulder   Right/Left Shoulder Right   Right Shoulder Flexion 4+/5  with pain    Right Shoulder Extension 5/5   Right Shoulder ABduction 3+/5  with pain    Right Shoulder Internal Rotation 5/5   Right Shoulder External Rotation --  5-/5, with pain                  [facility] Adult PT  Treatment/Exercise - 01/17/15 0001    Exercises   Exercises Shoulder   Shoulder Exercises: Standing   ABduction Strengthening;Right;10 reps;Weights   Shoulder ABduction Weight (lbs) 2# 1st set, 3# second set   mirror for visual feedback   Other Standing Exercises Rings on Arch (11) R<>L x 2 trials (one straight ahead, 2nd trial scaption.   Rt shoulder ABD to 125 at top of arch   Shoulder Exercises: Pulleys   Flexion 1 minute   ABduction 1 minute   Other Pulley Exercises IR - 1 min    Shoulder Exercises: ROM/Strengthening   UBE (Upper Arm Bike) Level 4- standing, 2 min each direction    Shoulder Exercises: Stretch   Corner Stretch 3 reps;30 seconds  with arm at 45 degrees and 90 degrees    Shoulder Exercises: Body Blade   Flexion 30 seconds;2 reps   ABduction 30 seconds;2 reps                  PT Short Term Goals - 01/14/15 1233    PT SHORT TERM GOAL #1   Title be independent iwth initail HEP   Time 4   Period Weeks   Status Achieved  PT SHORT TERM GOAL #2   Title report pain decrease by 50% with shoulder movement   Time 4   Period Weeks   Status Achieved   PT SHORT TERM GOAL #3   Title demo imporved right shoulder flex/abd to 130 degrees or better to improve function   Time 4   Status Partially Met   PT SHORT TERM GOAL #4   Title reach behind his back to waist level or better   Time 4   Period Weeks   Status Achieved           PT Long Term Goals - 01/04/15 1026    PT LONG TERM GOAL #1   Title demostrate and/or verbalize techiniques to reduce the risk of re-injury to include info on physical activity   PT LONG TERM GOAL #2   Title be independent with advanced HEP   PT LONG TERM GOAL #3   Title demo improed shoulder flex/abd to 150 degrees to normalize ADLS   PT LONG TERM GOAL #4   Title improve FOTO =/<31% limited   PT LONG TERM GOAL #5   Title demo imporved IR and ER to [facility] to allow normal dressing/bathing               Plan - 01/17/15  1145    Clinical Impression Statement Pt demonstrated improved ROM in R shoulder.  Pt continues to make progress towards all goals. Pt continues with decreased strength in Rt shoulder ABD. (issued exercise for this). Pt tolerated new exercises well, reported mild increase in pain that quickly resolved with stretches/ pendulums. No new goals met.    PT Frequency 2x / week   PT Duration 8 weeks   PT Treatment/Interventions Cryotherapy;Electrical Stimulation;Therapeutic exercise;Ultrasound;Moist Heat;Manual techniques;Patient/family education   PT Next Visit Plan continue with ROM focus on ABd and ER, flexion and IR is progressing well. Advance HEP.    Consulted and Agree with Plan of Care Patient        Problem List Patient Active Problem List   Diagnosis Date Noted  . Trigger thumb of right hand 03/10/2014  . Thumb tendonitis 02/27/2014  . FH: colon cancer 06/19/2013  . INFLAMED SEBORRHEIC KERATOSIS 04/11/2010  . COLONIC POLYPS, HX OF 03/24/2010  . ANXIETY 08/29/2007  . DEPRESSION 08/22/2007    Kerin Perna, PTA 01/17/2015 11:52 AM  [facility] [place], [place], [address] Phone: [phone]   Fax:  [phone]

## 2015-01-17 NOTE — Patient Instructions (Signed)
Scapula Adduction With Pectorals, Mid-Range   Stand in doorframe with palms against frame and arms at 90. Lean forward and squeeze shoulder blades. Hold _30__ seconds. Repeat _2__ times per session. Do __2_ sessions per day. Alternative: hand Rt arm off bed to stretch same area.   Copyright  VHI. All rights reserved.  Strengthening: Resisted Abduction   Hold tubing with right arm across body. Pull up and away from side. Move through pain-free range of motion. Repeat _10___ times per set. Do __2__ sets per session. Do _1___ sessions per day. STAND ON END OF BAND.  http://orth.exer.us/826   Copyright  VHI. All rights reserved.

## 2015-01-20 ENCOUNTER — Ambulatory Visit (INDEPENDENT_AMBULATORY_CARE_PROVIDER_SITE_OTHER): Payer: BLUE CROSS/BLUE SHIELD | Admitting: Physical Therapy

## 2015-01-20 DIAGNOSIS — M25611 Stiffness of right shoulder, not elsewhere classified: Secondary | ICD-10-CM | POA: Diagnosis not present

## 2015-01-20 NOTE — Patient Instructions (Signed)
Exercise band strength progression:  Yellow, red, green, blue.   At this time, use the following band for the following exercises until you are ready to progress to the next strength band.   Flexion (arm forward to 90 degrees): Red  2-3 sets of 10  Abduction (arm out to the side, thumb up): Red 2-3 sets of 10.   Extension (arm to the back): Red 2-3 sets of 10   Rowing with both arms: Blue 2-3 sets of 10  Internal (pulling in) / External rotation (rotate arm away) (thumb up, elbow squeezing towel to side): Green or Blue 2 sets of 10.   Continue stretches behind back, doorway stretch, wall slides.

## 2015-01-20 NOTE — Therapy (Signed)
[place], [place], [address] Phone: [phone]   Fax:  [phone]  Physical Therapy Treatment  Patient Details  Name: Derrick Villegas MRN: [id number] Date of Birth: 04-Jan-1961 Referring Provider:  Marletta Lor, MD  Encounter Date: 01/20/2015      PT End of Session - 01/20/15 1021    Visit Number 13   Number of Visits 16   Date for PT Re-Evaluation 02/02/15   PT Start Time 1019   PT Stop Time 1100   PT Time Calculation (min) 41 min   Activity Tolerance Patient tolerated treatment well   Behavior During Therapy [facility] for tasks assessed/performed      Past Medical History  Diagnosis Date  . ANXIETY 08/29/2007  . COLONIC POLYPS, HX OF 03/24/2010  . DEPRESSION 08/22/2007  . Inflamed seborrheic keratosis 04/11/2010    Past Surgical History  Procedure Laterality Date  . Coccyx removal    . Colonoscopy    . Polypectomy      There were no vitals taken for this visit.  Visit Diagnosis:  Stiffness of shoulder joint, right      Subjective Assessment - 01/20/15 1023    Symptoms Pt voiced being very upset regarding medical bills. Pt reported that new stretches for pec have helped ROM.    Currently in Pain? No/denies          [facility] PT Assessment - 01/20/15 0001    AROM   Right/Left Shoulder Right   Right Shoulder Flexion 160 Degrees  degrees, standing   Right Shoulder ABduction 155 Degrees  degrees, standing   Right Shoulder Internal Rotation 74 Degrees  degrees, supine ABD to 90deg   Right Shoulder External Rotation 72 Degrees  degrees, supine ABD to 90 deg   Strength   Strength Assessment Site Shoulder   Right/Left Shoulder Right   Right Shoulder Flexion --  5-/5, slight pain   Right Shoulder Extension 5/5   Right Shoulder ABduction 4+/5  with pain   Right Shoulder Internal Rotation 5/5   Right Shoulder External Rotation --  5-/5, slight pain                   [facility] Adult PT Treatment/Exercise - 01/20/15 0001    Exercises   Exercises Shoulder   Shoulder Exercises: Standing   External Rotation Strengthening;Right;10 reps   Theraband Level (Shoulder External Rotation) Level 4 (Blue)   Internal Rotation Strengthening;Right;10 reps   Theraband Level (Shoulder Internal Rotation) Level 4 (Blue)   Flexion Strengthening;Right;10 reps;Theraband  2 sets of 10   Theraband Level (Shoulder Flexion) Level 2 (Red)   ABduction Strengthening;Right;10 reps;Theraband   Theraband Level (Shoulder ABduction) Level 2 (Red)  2 sets of 10    Extension Strengthening;Right;5 reps;Theraband   Theraband Level (Shoulder Extension) Level 3 (Green)  elbow in extension   Row Strengthening;Both;20 reps;Theraband   Theraband Level (Shoulder Row) Level 4 (Blue)   Other Standing Exercises wall slides into ABD x 5 reps; Pec stretch at door way 30 sec x 3   Shoulder Exercises: Pulleys   Flexion 2 minutes   ABduction 3 minutes   Other Pulley Exercises IR - 3 min                 PT Education - 01/20/15 1112    Education provided Yes   Education Details HEP   Person(s) Educated Patient   Methods Handout;Explanation;Demonstration   Comprehension Verbalized understanding;Returned demonstration  PT Short Term Goals - 01/20/15 1122    PT SHORT TERM GOAL #1   Title be independent iwth initail HEP   Time 4   Status Achieved   PT SHORT TERM GOAL #2   Title report pain decrease by 50% with shoulder movement   Time 4   Period Weeks   Status Achieved   PT SHORT TERM GOAL #3   Title demo improved right shoulder flex/abd to 130 degrees or better to improve function   Time 4   Period Weeks   Status Achieved   PT SHORT TERM GOAL #4   Title reach behind his back to waist level or better   Time 4   Period Weeks   Status Achieved           PT Long Term Goals - 01/20/15 1055    PT LONG TERM GOAL #1   Title Demonstrate and/or verbalize techiniques to  reduce the risk of re-injury to include info on physical activity   Status Achieved   PT LONG TERM GOAL #2   Title be independent with advanced HEP   Status Achieved   PT LONG TERM GOAL #3   Title demo improved shoulder flex/abd to 150 degrees to normalize ADLS   Status Achieved   PT LONG TERM GOAL #4   Title improve FOTO =/<31% limited   Status Not Met  Pt scored 35% on 01/14/15. Pt refused to take survey at D/C.    PT LONG TERM GOAL #5   Title demo improved IR and ER to [facility] to allow normal dressing/bathing   Status Partially Met  Pt reported he is able to complete ADLs with much more ease now, despite not reaching Full ROM in Rt shoulder.                Plan - 01/20/15 1115    Clinical Impression Statement Pt has demonstrated improved Rt shoulder ROM, achieving his ROM goal.  Pt tolerated increased resistance with exercises without increase in pain. He has demonstrated independence with HEP and ways to prevent re-injury.  Pt reported he is satisfied with current level of function and is unable to financially afford continued treatment; he is interested in D/C at this time.    PT Treatment/Interventions Cryotherapy;Electrical Stimulation;Therapeutic exercise;Ultrasound;Moist Heat;Manual techniques;Patient/family education   PT Next Visit Plan Spoke to supervising PT regarding pt's wishes and goals met. Will D/C to HEP.    Consulted and Agree with Plan of Care Patient        Problem List Patient Active Problem List   Diagnosis Date Noted  . Trigger thumb of right hand 03/10/2014  . Thumb tendonitis 02/27/2014  . FH: colon cancer 06/19/2013  . INFLAMED SEBORRHEIC KERATOSIS 04/11/2010  . COLONIC POLYPS, HX OF 03/24/2010  . ANXIETY 08/29/2007  . DEPRESSION 08/22/2007  PHYSICAL THERAPY DISCHARGE SUMMARY  Visits from Start of Care: 13  Current functional level related to goals / functional outcomes: Patient has some functional deficits however he is pleased with current  level of function   Remaining deficits: As above   Education / Equipment: HEP Plan: Patient agrees to discharge.  Patient goals were partially met. Patient is being discharged due to being pleased with the current functional level.  ?Patient also concerned with the cost of PT services?      Manuela Schwartz shaver, PT 01/20/2015, 12:52 PM  [facility] [place], [place], [address] Phone: [phone]   Fax:  [phone]

## 2015-01-20 NOTE — Therapy (Signed)
[place], [place], [address] Phone: [phone]   Fax:  [phone]  Physical Therapy Treatment  Patient Details  Name: Derrick Villegas MRN: [id number] Date of Birth: 04-Jan-1961 Referring Provider:  Marletta Lor, MD  Encounter Date: 01/20/2015      PT End of Session - 01/20/15 1021    Visit Number 13   Number of Visits 16   Date for PT Re-Evaluation 02/02/15   PT Start Time 1019   PT Stop Time 1100   PT Time Calculation (min) 41 min   Activity Tolerance Patient tolerated treatment well   Behavior During Therapy [facility] for tasks assessed/performed      Past Medical History  Diagnosis Date  . ANXIETY 08/29/2007  . COLONIC POLYPS, HX OF 03/24/2010  . DEPRESSION 08/22/2007  . Inflamed seborrheic keratosis 04/11/2010    Past Surgical History  Procedure Laterality Date  . Coccyx removal    . Colonoscopy    . Polypectomy      There were no vitals taken for this visit.  Visit Diagnosis:  Stiffness of shoulder joint, right      Subjective Assessment - 01/20/15 1023    Symptoms Pt voiced being very upset regarding medical bills. Pt reported that new stretches for pec have helped ROM.    Currently in Pain? No/denies          [facility] PT Assessment - 01/20/15 0001    AROM   Right/Left Shoulder Right   Right Shoulder Flexion 160 Degrees  degrees, standing   Right Shoulder ABduction 155 Degrees  degrees, standing   Right Shoulder Internal Rotation 74 Degrees  degrees, supine ABD to 90deg   Right Shoulder External Rotation 72 Degrees  degrees, supine ABD to 90 deg   Strength   Strength Assessment Site Shoulder   Right/Left Shoulder Right   Right Shoulder Flexion --  5-/5, slight pain   Right Shoulder Extension 5/5   Right Shoulder ABduction 4+/5  with pain   Right Shoulder Internal Rotation 5/5   Right Shoulder External Rotation --  5-/5, slight pain                   [facility] Adult PT Treatment/Exercise - 01/20/15 0001    Exercises   Exercises Shoulder   Shoulder Exercises: Standing   External Rotation Strengthening;Right;10 reps   Theraband Level (Shoulder External Rotation) Level 4 (Blue)   Internal Rotation Strengthening;Right;10 reps   Theraband Level (Shoulder Internal Rotation) Level 4 (Blue)   Flexion Strengthening;Right;10 reps;Theraband  2 sets of 10   Theraband Level (Shoulder Flexion) Level 2 (Red)   ABduction Strengthening;Right;10 reps;Theraband   Theraband Level (Shoulder ABduction) Level 2 (Red)  2 sets of 10    Extension Strengthening;Right;5 reps;Theraband   Theraband Level (Shoulder Extension) Level 3 (Green)  elbow in extension   Row Strengthening;Both;20 reps;Theraband   Theraband Level (Shoulder Row) Level 4 (Blue)   Other Standing Exercises wall slides into ABD x 5 reps; Pec stretch at door way 30 sec x 3   Shoulder Exercises: Pulleys   Flexion 2 minutes   ABduction 3 minutes   Other Pulley Exercises IR - 3 min                 PT Education - 01/20/15 1112    Education provided Yes   Education Details HEP   Person(s) Educated Patient   Methods Handout;Explanation;Demonstration   Comprehension Verbalized understanding;Returned demonstration  PT Short Term Goals - 01/20/15 1122    PT SHORT TERM GOAL #1   Title be independent iwth initail HEP   Time 4   Status Achieved   PT SHORT TERM GOAL #2   Title report pain decrease by 50% with shoulder movement   Time 4   Period Weeks   Status Achieved   PT SHORT TERM GOAL #3   Title demo improved right shoulder flex/abd to 130 degrees or better to improve function   Time 4   Period Weeks   Status Achieved   PT SHORT TERM GOAL #4   Title reach behind his back to waist level or better   Time 4   Period Weeks   Status Achieved           PT Long Term Goals - 01/20/15 1055    PT LONG TERM GOAL #1   Title Demonstrate and/or verbalize techiniques to  reduce the risk of re-injury to include info on physical activity   Status Achieved   PT LONG TERM GOAL #2   Title be independent with advanced HEP   Status Achieved   PT LONG TERM GOAL #3   Title demo improved shoulder flex/abd to 150 degrees to normalize ADLS   Status Achieved   PT LONG TERM GOAL #4   Title improve FOTO =/<31% limited   Status Not Met  Pt scored 35% on 01/14/15. Pt refused to take survey at D/C.    PT LONG TERM GOAL #5   Title demo improved IR and ER to [facility] to allow normal dressing/bathing   Status Partially Met  Pt reported he is able to complete ADLs with much more ease now, despite not reaching Full ROM in Rt shoulder.                Plan - 01/20/15 1115    Clinical Impression Statement Pt has demonstrated improved Rt shoulder ROM, achieving his ROM goal.  Pt tolerated increased resistance with exercises without increase in pain. He has demonstrated independence with HEP and ways to prevent re-injury.  Pt reported he is satisfied with current level of function and is unable to financially afford continued treatment; he is interested in D/C at this time.    PT Treatment/Interventions Cryotherapy;Electrical Stimulation;Therapeutic exercise;Ultrasound;Moist Heat;Manual techniques;Patient/family education   PT Next Visit Plan Spoke to supervising PT regarding pt's wishes and goals met. Will D/C to HEP.    Consulted and Agree with Plan of Care Patient        Problem List Patient Active Problem List   Diagnosis Date Noted  . Trigger thumb of right hand 03/10/2014  . Thumb tendonitis 02/27/2014  . FH: colon cancer 06/19/2013  . INFLAMED SEBORRHEIC KERATOSIS 04/11/2010  . COLONIC POLYPS, HX OF 03/24/2010  . ANXIETY 08/29/2007  . DEPRESSION 08/22/2007   Kerin Perna, PTA 01/20/2015 11:23 AM   [facility] [place], [place], [address] Phone: [phone]   Fax:   [phone]

## 2015-01-24 ENCOUNTER — Encounter: Payer: BLUE CROSS/BLUE SHIELD | Admitting: Physical Therapy

## 2015-01-26 ENCOUNTER — Other Ambulatory Visit: Payer: Self-pay | Admitting: Internal Medicine

## 2015-01-27 ENCOUNTER — Encounter: Payer: BLUE CROSS/BLUE SHIELD | Admitting: Physical Therapy

## 2015-05-26 ENCOUNTER — Encounter: Payer: Self-pay | Admitting: Gastroenterology

## 2015-06-22 ENCOUNTER — Ambulatory Visit (INDEPENDENT_AMBULATORY_CARE_PROVIDER_SITE_OTHER): Payer: BLUE CROSS/BLUE SHIELD | Admitting: Family Medicine

## 2015-06-22 ENCOUNTER — Encounter: Payer: Self-pay | Admitting: Family Medicine

## 2015-06-22 ENCOUNTER — Other Ambulatory Visit (INDEPENDENT_AMBULATORY_CARE_PROVIDER_SITE_OTHER): Payer: BLUE CROSS/BLUE SHIELD

## 2015-06-22 VITALS — BP 138/86 | HR 69 | Ht 69.75 in | Wt 161.0 lb

## 2015-06-22 DIAGNOSIS — M65311 Trigger thumb, right thumb: Secondary | ICD-10-CM | POA: Diagnosis not present

## 2015-06-22 DIAGNOSIS — M79644 Pain in right finger(s): Secondary | ICD-10-CM | POA: Diagnosis not present

## 2015-06-22 NOTE — Assessment & Plan Note (Signed)
Patient was given another injection today. Patient tolerated the procedure very well. Patient was having some calcium deposits and we will make sure that this is been reabsorbed. Otherwise imaging including an x-ray would be necessary to see if there is any joint involvement that could also be contributing. Patient does work on a [employer] and this is affecting his livelihood. Patient come back in 2-3 weeks and will be doing splinting in the interim.

## 2015-06-22 NOTE — Patient Instructions (Signed)
Nice to see you again Vitamin D 2000 units/day Ice is your friend Will be painful for about a week Ok to message Good luck on the splint See me again in 2-3 weeks to make calcium break up

## 2015-06-22 NOTE — Progress Notes (Signed)
  [facility] [place], [name] Subjective:     CC: Right thumb pain follow up  LOV:Derrick Villegas is a 54 y.o. male coming in for followup of his left thumb pain. Patient was seen previously and given anti-inflammatories as well as corticosteroid injection into the trigger finger. Patient last had his injection greater than 15 months ago. Patient states over the last 3 weeks it has started to increase again. Same symptoms.     Past medical history, social, surgical and family history all reviewed in electronic medical record.   Review of Systems: No headache, visual changes, nausea, vomiting, diarrhea, constipation, dizziness, abdominal pain, skin rash, fevers, chills, night sweats, weight loss, swollen lymph nodes, body aches, joint swelling, muscle aches, chest pain, shortness of breath, mood changes.   Objective Blood pressure 138/86, pulse 69, height 5' 9.75" (1.772 m), weight 161 lb (73.029 kg), SpO2 98 %.  General: No apparent distress alert and oriented x3 mood and affect normal, dressed appropriately.  HEENT: Pupils equal, extraocular movements intact  Respiratory: Patient's speak in full sentences and does not appear short of breath  Cardiovascular: No lower extremity edema, non tender, no erythema  Skin: Warm dry intact with no signs of infection or rash on extremities or on axial skeleton.  Abdomen: Soft nontender  Neuro: Cranial nerves II through XII are intact, neurovascularly intact in all extremities with 2+ DTRs and 2+ pulses.  Lymph: No lymphadenopathy of posterior or anterior cervical chain or axillae bilaterally.  Gait normal with good balance and coordination.  MSK:  Non tender with full range of motion and good stability and symmetric strength and tone of shoulders, elbows,  hip, knee and ankles bilaterally.  Wrist: Right Inspection normal with no visible erythema or swelling. ROM  smooth and normal with good flexion and extension and ulnar/radial deviation that is symmetrical with opposite wrist. Palpation is normal over metacarpals, navicular, lunate, and TFCC; tendons without tenderness/ swelling No snuffbox tenderness. No tenderness over Canal of [place]. Strength 5/5 in all directions without pain. Negative Finkelstein, tinel's and phalens. Negative Watson's test. Patient's thumb has mild triggering and does have pain over the IP joint Neurovascularly intact distally. MSK US performed of: Right wrist This study was ordered, performed, and interpreted by Charlann Boxer D.O.  Wrist: All extensor compartments visualized and tendons all normal in appearance without fraying, tears, or sheath effusions. No effusion seen. TFCC intact. Scapholunate ligament intact. Carpal tunnel visualized and median nerve area normal, flexor tendons all normal in appearance without fraying, tears, or sheath effusions. Power doppler signal normal. Patient's flexor tendon of the thumb does show that patient has a nodule over the IP joints but does have calcific changes.  IMPRESSION: Tenosynovitis with nodule noted. Thumb trigger finger.  After verbal consent patient was prepped with alcohol swabs and with a 25-gauge medications needle was injected with 0.5% Marcaine in the amount of 1 cc and 0.5 cc of Kenalog 40 mg/dL. Into tendon sheath with ultrasound guidance. Tolerated procedure well.  Post injections instructions given.     Impression and Recommendations:     This case required medical decision making of moderate complexity.

## 2015-07-07 ENCOUNTER — Ambulatory Visit: Payer: BLUE CROSS/BLUE SHIELD | Admitting: Family Medicine

## 2015-07-08 ENCOUNTER — Ambulatory Visit: Payer: BLUE CROSS/BLUE SHIELD | Admitting: Family Medicine

## 2015-07-14 ENCOUNTER — Other Ambulatory Visit (INDEPENDENT_AMBULATORY_CARE_PROVIDER_SITE_OTHER): Payer: BLUE CROSS/BLUE SHIELD

## 2015-07-14 DIAGNOSIS — Z0000 Encounter for general adult medical examination without abnormal findings: Secondary | ICD-10-CM

## 2015-07-14 LAB — POCT URINALYSIS DIPSTICK
Bilirubin, UA: NEGATIVE
Glucose, UA: NEGATIVE
KETONES UA: NEGATIVE
LEUKOCYTES UA: NEGATIVE
Nitrite, UA: NEGATIVE
PH UA: 7
PROTEIN UA: NEGATIVE
SPEC GRAV UA: 1.015
Urobilinogen, UA: 0.2

## 2015-07-14 LAB — BASIC METABOLIC PANEL
BUN: 14 mg/dL (ref 6–23)
CO2: 30 mEq/L (ref 19–32)
Calcium: 10 mg/dL (ref 8.4–10.5)
Chloride: 102 mEq/L (ref 96–112)
Creatinine, Ser: 1.14 mg/dL (ref 0.40–1.50)
GFR: 70.51 mL/min (ref >60.00)
GLUCOSE: 104 mg/dL — AB (ref 70–99)
POTASSIUM: 5.2 meq/L — AB (ref 3.5–5.1)
Sodium: 140 mEq/L (ref 135–145)

## 2015-07-14 LAB — TSH: TSH: 2.51 u[IU]/mL (ref 0.35–4.50)

## 2015-07-14 LAB — LIPID PANEL
Cholesterol: 216 mg/dL — ABNORMAL HIGH (ref 0–200)
HDL: 65.2 mg/dL (ref >39.00)
LDL Cholesterol: 132 mg/dL — ABNORMAL HIGH (ref 0–99)
NONHDL: 150.53
Total CHOL/HDL Ratio: 3
Triglycerides: 95 mg/dL (ref 0.0–149.0)
VLDL: 19 mg/dL (ref 0.0–40.0)

## 2015-07-14 LAB — HEPATIC FUNCTION PANEL
ALT: 21 U/L (ref 0–53)
AST: 24 U/L (ref 0–37)
Albumin: 4.6 g/dL (ref 3.5–5.2)
Alkaline Phosphatase: 89 U/L (ref 39–117)
Bilirubin, Direct: 0.1 mg/dL (ref 0.0–0.3)
Total Bilirubin: 0.6 mg/dL (ref 0.2–1.2)
Total Protein: 7.3 g/dL (ref 6.0–8.3)

## 2015-07-14 LAB — CBC WITH DIFFERENTIAL/PLATELET
BASOS PCT: 0.6 % (ref 0.0–3.0)
Basophils Absolute: 0.1 10*3/uL (ref 0.0–0.1)
EOS PCT: 2.4 % (ref 0.0–5.0)
Eosinophils Absolute: 0.2 10*3/uL (ref 0.0–0.7)
HCT: 46.2 % (ref 39.0–52.0)
Hemoglobin: 15.5 g/dL (ref 13.0–17.0)
LYMPHS ABS: 2.3 10*3/uL (ref 0.7–4.0)
Lymphocytes Relative: 25.6 % (ref 12.0–46.0)
MCHC: 33.5 g/dL (ref 30.0–36.0)
MCV: 94 fl (ref 78.0–100.0)
MONO ABS: 1.2 10*3/uL — AB (ref 0.1–1.0)
Monocytes Relative: 12.8 % — ABNORMAL HIGH (ref 3.0–12.0)
NEUTROS PCT: 58.6 % (ref 43.0–77.0)
Neutro Abs: 5.4 10*3/uL (ref 1.4–7.7)
PLATELETS: 279 10*3/uL (ref 150.0–400.0)
RBC: 4.91 Mil/uL (ref 4.22–5.81)
RDW: 14.5 % (ref 11.5–15.5)
WBC: 9.2 10*3/uL (ref 4.0–10.5)

## 2015-07-14 LAB — PSA: PSA: 0.86 ng/mL (ref 0.10–4.00)

## 2015-07-21 ENCOUNTER — Ambulatory Visit (INDEPENDENT_AMBULATORY_CARE_PROVIDER_SITE_OTHER): Payer: BLUE CROSS/BLUE SHIELD | Admitting: Internal Medicine

## 2015-07-21 ENCOUNTER — Encounter: Payer: Self-pay | Admitting: Internal Medicine

## 2015-07-21 VITALS — BP 160/92 | Temp 98.2°F | Ht 69.5 in | Wt 160.0 lb

## 2015-07-21 DIAGNOSIS — Z800 Family history of malignant neoplasm of digestive organs: Secondary | ICD-10-CM

## 2015-07-21 DIAGNOSIS — Z0000 Encounter for general adult medical examination without abnormal findings: Secondary | ICD-10-CM | POA: Diagnosis not present

## 2015-07-21 DIAGNOSIS — F411 Generalized anxiety disorder: Secondary | ICD-10-CM

## 2015-07-21 MED ORDER — ALPRAZOLAM 0.5 MG PO TABS: 0.5000 mg | ORAL_TABLET | Freq: Three times a day (TID) | ORAL | Status: DC | PRN

## 2015-07-21 NOTE — Patient Instructions (Signed)
Limit your sodium (Salt) intake  Please check your blood pressure on a regular basis.  If it is consistently greater than 150/90, please make an office appointment.  DASH Eating Plan DASH stands for "Dietary Approaches to Stop Hypertension." The DASH eating plan is a healthy eating plan that has been shown to reduce high blood pressure (hypertension). Additional health benefits may include reducing the risk of type 2 diabetes mellitus, heart disease, and stroke. The DASH eating plan may also help with weight loss. WHAT DO I NEED TO KNOW ABOUT THE DASH EATING PLAN? For the DASH eating plan, you will follow these general guidelines:  Choose foods with a percent daily value for sodium of less than 5% (as listed on the food label).  Use salt-free seasonings or herbs instead of table salt or sea salt.  Check with your health care provider or pharmacist before using salt substitutes.  Eat lower-sodium products, often labeled as "lower sodium" or "no salt added."  Eat fresh foods.  Eat more vegetables, fruits, and low-fat dairy products.  Choose whole grains. Look for the word "whole" as the first word in the ingredient list.  Choose fish and skinless chicken or [place] more often than red meat. Limit fish, poultry, and meat to 6 oz (170 g) each day.  Limit sweets, desserts, sugars, and sugary drinks.  Choose heart-healthy fats.  Limit cheese to 1 oz (28 g) per day.  Eat more home-cooked food and less restaurant, buffet, and fast food.  Limit fried foods.  Cook foods using methods other than frying.  Limit canned vegetables. If you do use them, rinse them well to decrease the sodium.  When eating at a restaurant, ask that your food be prepared with less salt, or no salt if possible. WHAT FOODS CAN I EAT? Seek help from a dietitian for individual calorie needs. Grains Whole grain or whole wheat bread. Brown rice. Whole grain or whole wheat pasta. Quinoa, bulgur, and whole grain  cereals. Low-sodium cereals. Corn or whole wheat flour tortillas. Whole grain cornbread. Whole grain crackers. Low-sodium crackers. Vegetables Fresh or frozen vegetables (raw, steamed, roasted, or grilled). Low-sodium or reduced-sodium tomato and vegetable juices. Low-sodium or reduced-sodium tomato sauce and paste. Low-sodium or reduced-sodium canned vegetables.  Fruits All fresh, canned (in natural juice), or frozen fruits. Meat and Other Protein Products Ground beef (85% or leaner), grass-fed beef, or beef trimmed of fat. Skinless chicken or [place]. Ground chicken or [place]. Pork trimmed of fat. All fish and seafood. Eggs. Dried beans, peas, or lentils. Unsalted nuts and seeds. Unsalted canned beans. Dairy Low-fat dairy products, such as skim or 1% milk, 2% or reduced-fat cheeses, low-fat ricotta or cottage cheese, or plain low-fat yogurt. Low-sodium or reduced-sodium cheeses. Fats and Oils Tub margarines without trans fats. Light or reduced-fat mayonnaise and salad dressings (reduced sodium). Avocado. Safflower, olive, or canola oils. Natural peanut or almond butter. Other Unsalted popcorn and pretzels. The items listed above may not be a complete list of recommended foods or beverages. Contact your dietitian for more options. WHAT FOODS ARE NOT RECOMMENDED? Grains White bread. White pasta. White rice. Refined cornbread. Bagels and croissants. Crackers that contain trans fat. Vegetables Creamed or fried vegetables. Vegetables in a cheese sauce. Regular canned vegetables. Regular canned tomato sauce and paste. Regular tomato and vegetable juices. Fruits Dried fruits. Canned fruit in light or heavy syrup. Fruit juice. Meat and Other Protein Products Fatty cuts of meat. Ribs, chicken wings, bacon, sausage, bologna, salami, chitterlings, fatback, hot  dogs, bratwurst, and packaged luncheon meats. Salted nuts and seeds. Canned beans with salt. Dairy Whole or 2% milk, cream, half-and-half, and  cream cheese. Whole-fat or sweetened yogurt. Full-fat cheeses or blue cheese. Nondairy creamers and whipped toppings. Processed cheese, cheese spreads, or cheese curds. Condiments Onion and garlic salt, seasoned salt, table salt, and sea salt. Canned and packaged gravies. Worcestershire sauce. Tartar sauce. Barbecue sauce. Teriyaki sauce. Soy sauce, including reduced sodium. Steak sauce. Fish sauce. Oyster sauce. Cocktail sauce. Horseradish. Ketchup and mustard. Meat flavorings and tenderizers. Bouillon cubes. Hot sauce. Tabasco sauce. Marinades. Taco seasonings. Relishes. Fats and Oils Butter, stick margarine, lard, shortening, ghee, and bacon fat. Coconut, palm kernel, or palm oils. Regular salad dressings. Other Pickles and olives. Salted popcorn and pretzels. The items listed above may not be a complete list of foods and beverages to avoid. Contact your dietitian for more information. WHERE CAN I FIND MORE INFORMATION? National Heart, Lung, and Blood Institute: travelstabloid.com Document Released: 10/25/2011 Document Revised: 03/22/2014 Document Reviewed: 09/09/2013 [facility] Patient Information 2015 [place], [place]. This information is not intended to replace advice given to you by your health care provider. Make sure you discuss any questions you have with your health care provider. Health Maintenance A healthy lifestyle and preventative care can promote health and wellness.  Maintain regular health, dental, and eye exams.  Eat a healthy diet. Foods like vegetables, fruits, whole grains, low-fat dairy products, and lean protein foods contain the nutrients you need and are low in calories. Decrease your intake of foods high in solid fats, added sugars, and salt. Get information about a proper diet from your health care provider, if necessary.  Regular physical exercise is one of the most important things you can do for your health. Most adults should get at  least 150 minutes of moderate-intensity exercise (any activity that increases your heart rate and causes you to sweat) each week. In addition, most adults need muscle-strengthening exercises on 2 or more days a week.   Maintain a healthy weight. The body mass index (BMI) is a screening tool to identify possible weight problems. It provides an estimate of body fat based on height and weight. Your health care provider can find your BMI and can help you achieve or maintain a healthy weight. For males 20 years and older:  A BMI below 18.5 is considered underweight.  A BMI of 18.5 to 24.9 is normal.  A BMI of 25 to 29.9 is considered overweight.  A BMI of 30 and above is considered obese.  Maintain normal blood lipids and cholesterol by exercising and minimizing your intake of saturated fat. Eat a balanced diet with plenty of fruits and vegetables. Blood tests for lipids and cholesterol should begin at age 62 and be repeated every 5 years. If your lipid or cholesterol levels are high, you are over age 27, or you are at high risk for heart disease, you may need your cholesterol levels checked more frequently.Ongoing high lipid and cholesterol levels should be treated with medicines if diet and exercise are not working.  If you smoke, find out from your health care provider how to quit. If you do not use tobacco, do not start.  Lung cancer screening is recommended for adults aged 50-80 years who are at high risk for developing lung cancer because of a history of smoking. A yearly low-dose CT scan of the lungs is recommended for people who have at least a 30-pack-year history of smoking and are current smokers or  have quit within the past 15 years. A pack year of smoking is smoking an average of 1 pack of cigarettes a day for 1 year (for example, a 30-pack-year history of smoking could mean smoking 1 pack a day for 30 years or 2 packs a day for 15 years). Yearly screening should continue until the smoker  has stopped smoking for at least 15 years. Yearly screening should be stopped for people who develop a health problem that would prevent them from having lung cancer treatment.  If you choose to drink alcohol, do not have more than 2 drinks per day. One drink is considered to be 12 oz (360 mL) of beer, 5 oz (150 mL) of wine, or 1.5 oz (45 mL) of liquor.  Avoid the use of street drugs. Do not share needles with anyone. Ask for help if you need support or instructions about stopping the use of drugs.  High blood pressure causes heart disease and increases the risk of stroke. Blood pressure should be checked at least every 1-2 years. Ongoing high blood pressure should be treated with medicines if weight loss and exercise are not effective.  If you are 74-57 years old, ask your health care provider if you should take aspirin to prevent heart disease.  Diabetes screening involves taking a blood sample to check your fasting blood sugar level. This should be done once every 3 years after age 52 if you are at a normal weight and without risk factors for diabetes. Testing should be considered at a younger age or be carried out more frequently if you are overweight and have at least 1 risk factor for diabetes.  Colorectal cancer can be detected and often prevented. Most routine colorectal cancer screening begins at the age of 22 and continues through age 54. However, your health care provider may recommend screening at an earlier age if you have risk factors for colon cancer. On a yearly basis, your health care provider may provide home test kits to check for hidden blood in the stool. A small camera at the end of a tube may be used to directly examine the colon (sigmoidoscopy or colonoscopy) to detect the earliest forms of colorectal cancer. Talk to your health care provider about this at age 29 when routine screening begins. A direct exam of the colon should be repeated every 5-10 years through age 46, unless  early forms of precancerous polyps or small growths are found.  People who are at an increased risk for hepatitis B should be screened for this virus. You are considered at high risk for hepatitis B if:  You were born in a country where hepatitis B occurs often. Talk with your health care provider about which countries are considered high risk.  Your parents were born in a high-risk country and you have not received a shot to protect against hepatitis B (hepatitis B vaccine).  You have HIV or AIDS.  You use needles to inject street drugs.  You live with, or have sex with, someone who has hepatitis B.  You are a man who has sex with other men (MSM).  You get hemodialysis treatment.  You take certain medicines for conditions like cancer, organ transplantation, and autoimmune conditions.  Hepatitis C blood testing is recommended for all people born from [birth year] through 1967 and any individual with known risk factors for hepatitis C.  Healthy men should no longer receive prostate-specific antigen (PSA) blood tests as part of routine cancer screening. Talk to your  health care provider about prostate cancer screening.  Testicular cancer screening is not recommended for adolescents or adult males who have no symptoms. Screening includes self-exam, a health care provider exam, and other screening tests. Consult with your health care provider about any symptoms you have or any concerns you have about testicular cancer.  Practice safe sex. Use condoms and avoid high-risk sexual practices to reduce the spread of sexually transmitted infections (STIs).  You should be screened for STIs, including gonorrhea and chlamydia if:  You are sexually active and are younger than 24 years.  You are older than 24 years, and your health care provider tells you that you are at risk for this type of infection.  Your sexual activity has changed since you were last screened, and you are at an increased risk for  chlamydia or gonorrhea. Ask your health care provider if you are at risk.  If you are at risk of being infected with HIV, it is recommended that you take a prescription medicine daily to prevent HIV infection. This is called pre-exposure prophylaxis (PrEP). You are considered at risk if:  You are a man who has sex with other men (MSM).  You are a heterosexual man who is sexually active with multiple partners.  You take drugs by injection.  You are sexually active with a partner who has HIV.  Talk with your health care provider about whether you are at high risk of being infected with HIV. If you choose to begin PrEP, you should first be tested for HIV. You should then be tested every 3 months for as long as you are taking PrEP.  Use sunscreen. Apply sunscreen liberally and repeatedly throughout the day. You should seek shade when your shadow is shorter than you. Protect yourself by wearing long sleeves, pants, a wide-brimmed hat, and sunglasses year round whenever you are outdoors.  Tell your health care provider of new moles or changes in moles, especially if there is a change in shape or color. Also, tell your health care provider if a mole is larger than the size of a pencil eraser.  A one-time screening for abdominal aortic aneurysm (AAA) and surgical repair of large AAAs by ultrasound is recommended for men aged 3-75 years who are current or former smokers.  Stay current with your vaccines (immunizations). Document Released: 05/03/2008 Document Revised: 11/10/2013 Document Reviewed: 04/02/2011 [facility] Patient Information 2015 [place], [place]. This information is not intended to replace advice given to you by your health care provider. Make sure you discuss any questions you have with your health care provider.

## 2015-07-21 NOTE — Progress Notes (Signed)
Subjective:    Patient ID: Derrick Villegas, male    DOB: 01/11/1961, 54 y.o.   MRN: [id number]  HPI    Subjective:    Patient ID: Derrick Villegas, male    DOB: 03/09/1961, 54 y.o.   MRN: [id number]  HPI  54 -year-old patient who is seen today for a preventive health examination.   He has a history of mild anxiety disorder and allergic rhinitis.  In general, does quite well.  He has a personal history of colonic polyps and also a first degree relative (.  Brother) history of colon cancer.  Last colonoscopy was December 2015  He has been under considerable situational stress due to the health of his mother, who now resides in an assisted living facility due to rapidly progressive dementia over the past 12 months  Family history father had a history of prostate cancer and died of pancreatic cancer.  Motherwith a   diagnosis of retinal melanoma.  Paternal aunt has a history of lung cancer.  Brother is followed for colon cancer  Past Medical History  Diagnosis Date  . ANXIETY 08/29/2007  . COLONIC POLYPS, HX OF 03/24/2010  . DEPRESSION 08/22/2007  . Inflamed seborrheic keratosis 04/11/2010    Social History   Social History  . Marital Status: Single    Spouse Name: N/A  . Number of Children: N/A  . Years of Education: N/A   Occupational History  . Not on file.   Social History Main Topics  . Smoking status: Former Smoker    Quit date: 11/19/1993  . Smokeless tobacco: Never Used  . Alcohol Use: No  . Drug Use: No  . Sexual Activity: Not on file   Other Topics Concern  . Not on file   Social History Narrative    Past Surgical History  Procedure Laterality Date  . Coccyx removal    . Colonoscopy    . Polypectomy      Family History  Problem Relation Age of Onset  . Thyroid disease Mother   . Hypertension Father   . Kidney disease Father   . Pancreatic cancer Father   . Prostate cancer Father   . Hypertension Brother   . Cancer Brother     Colon cancer  . Colon  cancer Brother 47  . Esophageal cancer Neg Hx   . Rectal cancer Neg Hx   . Stomach cancer Neg Hx     Allergies  Allergen Reactions  . Paroxetine     REACTION: nausea, tingling in arms    Current Outpatient Prescriptions on File Prior to Visit  Medication Sig Dispense Refill  . ALPRAZolam (XANAX) 0.5 MG tablet TAKE ONE TABLET BY MOUTH THREE TIMES DAILY if NEEDED FOR SLEEP  60 tablet 5  . aspirin 81 MG tablet Take 81 mg by mouth daily.    . fluticasone (FLONASE) 50 MCG/ACT nasal spray Place 2 sprays into both nostrils daily. 16 g 6  . Multiple Vitamin (MULTIVITAMIN) tablet Take 1 tablet by mouth daily.    . Omega-3 Fatty Acids (FISH OIL) 1000 MG CAPS Take 1 capsule by mouth daily.     No current facility-administered medications on file prior to visit.    BP 160/92 mmHg  Temp(Src) 98.2 F (36.8 C) (Oral)  Ht 5' 9.5" (1.765 m)  Wt 160 lb (72.576 kg)  BMI 23.30 kg/m2     Review of Systems  Constitutional: Negative for fever, chills, appetite change and fatigue.  HENT: Negative for  congestion, dental problem, ear pain, hearing loss, sore throat, tinnitus, trouble swallowing and voice change.   Eyes: Negative for pain, discharge and visual disturbance.  Respiratory: Negative for cough, chest tightness, wheezing and stridor.   Cardiovascular: Negative for chest pain, palpitations and leg swelling.  Gastrointestinal: Negative for nausea, vomiting, abdominal pain, diarrhea, constipation, blood in stool and abdominal distention.  Genitourinary: Negative for urgency, hematuria, flank pain, discharge, difficulty urinating and genital sores.  Musculoskeletal: Negative for arthralgias, back pain, gait problem, joint swelling, myalgias and neck stiffness.  Skin: Negative for rash.  Neurological: Negative for dizziness, syncope, speech difficulty, weakness, numbness and headaches.  Hematological: Negative for adenopathy. Does not bruise/bleed easily.  Psychiatric/Behavioral: Negative for  behavioral problems and dysphoric mood. The patient is not nervous/anxious.        Objective:   Physical Exam  Constitutional: He appears well-developed and well-nourished.  HENT:  Head: Normocephalic and atraumatic.  Right Ear: External ear normal.  Left Ear: External ear normal.  Nose: Nose normal.  Mouth/Throat: Oropharynx is clear and moist.  Eyes: Conjunctivae and EOM are normal. Pupils are equal, round, and reactive to light. No scleral icterus.  Neck: Normal range of motion. Neck supple. No JVD present. No thyromegaly present.  Cardiovascular: Regular rhythm, normal heart sounds and intact distal pulses.  Exam reveals no gallop and no friction rub.   No murmur heard. Pulmonary/Chest: Effort normal and breath sounds normal. He exhibits no tenderness.  Abdominal: Soft. Bowel sounds are normal. He exhibits no distension and no mass. There is no tenderness.  Genitourinary: Prostate normal and penis normal. Guaiac negative stool.  Musculoskeletal: Normal range of motion. He exhibits no edema and no tenderness.  Lymphadenopathy:    He has no cervical adenopathy.  Neurological: He is alert. He has normal reflexes. No cranial nerve deficit. Coordination normal.  Skin: Skin is warm and dry. No rash noted.  Psychiatric: He has a normal mood and affect. His behavior is normal.          Assessment & Plan:   Preventive health exam Family history of colon cancer and personal history colonic polyps.  Will.  Continue  his five-year colonoscopy Regimen  Anxiety disorder.  Alprazolam refilled.  Behavioral health referral.  Discussed and recommended.  Information dispensed  Rule out sustained hypertension.  Home blood pressure monitoring.  Encouraged.  Will place on a DASH diet.  Final blood pressure 130/80  Recheck one year  Review of Systems As above     Objective:   Physical Exam   As above     Assessment & Plan:As above

## 2015-08-08 ENCOUNTER — Other Ambulatory Visit: Payer: Self-pay | Admitting: Internal Medicine

## 2015-11-01 ENCOUNTER — Encounter: Payer: Self-pay | Admitting: Family Medicine

## 2015-11-01 ENCOUNTER — Ambulatory Visit (INDEPENDENT_AMBULATORY_CARE_PROVIDER_SITE_OTHER): Payer: BLUE CROSS/BLUE SHIELD | Admitting: Family Medicine

## 2015-11-01 ENCOUNTER — Other Ambulatory Visit (INDEPENDENT_AMBULATORY_CARE_PROVIDER_SITE_OTHER): Payer: BLUE CROSS/BLUE SHIELD

## 2015-11-01 VITALS — BP 132/74 | HR 66 | Ht 69.5 in | Wt 156.0 lb

## 2015-11-01 DIAGNOSIS — M65311 Trigger thumb, right thumb: Secondary | ICD-10-CM | POA: Diagnosis not present

## 2015-11-01 DIAGNOSIS — M79644 Pain in right finger(s): Secondary | ICD-10-CM | POA: Diagnosis not present

## 2015-11-01 NOTE — Progress Notes (Signed)
Pre visit review using our clinic review tool, if applicable. No additional management support is needed unless otherwise documented below in the visit note. 

## 2015-11-01 NOTE — Patient Instructions (Signed)
I am sorry about the thumb I will be thinking about you and your situation I will get you in with a hand specialist as soon as we can.  I am here if you have questions.

## 2015-11-01 NOTE — Assessment & Plan Note (Signed)
Patient continues to have calcific changes giving him a trigger finger. Patient has failed all conservative therapy including 3 different injections over the course last 18 months. Patient does not want to try any more injections or any custom bracing. Patient will be sent to a hand specialist for further evaluation for possible surgical intervention. Patient states that this is affecting his livelihood she needs to have it fixed as soon as possible.  Spent  25 minutes with patient face-to-face and had greater than 50% of counseling including as described above in assessment and plan.

## 2015-11-01 NOTE — Progress Notes (Signed)
  [facility] [address], [place] [address] Phone: [phone] Subjective:     CC: Right thumb pain follow up  QA:[id number] DVONTA CALTRIDER is a 54 y.o. male coming in for followup of his left thumb pain. Patient was seen 4 months ago. Patient states that the last injection given at that time did not seem to be very effective. States that the pain seemed to come back within several weeks. Continues to have more of the snapping sensation. Get stuck in a flexed position in the morning. States that he can start increasing his range of motion but if he hits it just correctly unfortunately has more pain. Patient has failed all other conservative therapy and this is affecting his daily activities as well as his livelihood.       Past medical history, social, surgical and family history all reviewed in electronic medical record.   Review of Systems: No headache, visual changes, nausea, vomiting, diarrhea, constipation, dizziness, abdominal pain, skin rash, fevers, chills, night sweats, weight loss, swollen lymph nodes, body aches, joint swelling, muscle aches, chest pain, shortness of breath, mood changes.   Objective Blood pressure 132/74, pulse 66, height 5' 9.5" (1.765 m), weight 156 lb (70.761 kg), SpO2 95 %.  General: No apparent distress alert and oriented x3 mood and affect normal, dressed appropriately.  HEENT: Pupils equal, extraocular movements intact  Respiratory: Patient's speak in full sentences and does not appear short of breath  Cardiovascular: No lower extremity edema, non tender, no erythema  Skin: Warm dry intact with no signs of infection or rash on extremities or on axial skeleton.  Abdomen: Soft nontender  Neuro: Cranial nerves II through XII are intact, neurovascularly intact in all extremities with 2+ DTRs and 2+ pulses.  Lymph: No lymphadenopathy of posterior or anterior cervical chain or axillae bilaterally.  Gait normal with good  balance and coordination.  MSK:  Non tender with full range of motion and good stability and symmetric strength and tone of shoulders, elbows,  hip, knee and ankles bilaterally.  Wrist: Right Inspection normal with no visible erythema or swelling. ROM smooth and normal with good flexion and extension and ulnar/radial deviation that is symmetrical with opposite wrist. Palpation is normal over metacarpals, navicular, lunate, and TFCC; tendons without tenderness/ swelling No snuffbox tenderness. No tenderness over Canal of [place]. Strength 5/5 in all directions without pain. Negative Finkelstein, tinel's and phalens. Negative Watson's test. Patient's thumb has mild triggering and does have pain over the IP joint Neurovascularly intact distally.  MSK US performed of: Right wrist This study was ordered, performed, and interpreted by Charlann Boxer D.O.  Wrist: All extensor compartments visualized and tendons all normal in appearance without fraying, tears, or sheath effusions. No effusion seen. TFCC intact. Scapholunate ligament intact. Carpal tunnel visualized and median nerve area normal, flexor tendons all normal in appearance without fraying, tears, or sheath effusions. Power doppler signal normal. Patient's flexor tendon of the thumb does show that patient has a nodule over the IP joints but does have calcific changes. Significant change from previous exam patient does now have some mild arthritic changes of the joint as well.  IMPRESSION: Tenosynovitis with nodule noted. Thumb trigger finger with calcific changes and mild underlying arthritic changes.     Impression and Recommendations:     This case required medical decision making of moderate complexity.

## 2015-11-17 ENCOUNTER — Other Ambulatory Visit: Payer: Self-pay | Admitting: Orthopedic Surgery

## 2015-11-17 DIAGNOSIS — D492 Neoplasm of unspecified behavior of bone, soft tissue, and skin: Secondary | ICD-10-CM

## 2015-11-21 ENCOUNTER — Other Ambulatory Visit: Payer: Self-pay | Admitting: Internal Medicine

## 2015-11-28 ENCOUNTER — Other Ambulatory Visit: Payer: BLUE CROSS/BLUE SHIELD

## 2015-12-03 ENCOUNTER — Ambulatory Visit
Admission: RE | Admit: 2015-12-03 | Discharge: 2015-12-03 | Disposition: A | Discharge status: Home / Self Care | Payer: BLUE CROSS/BLUE SHIELD | Source: Ambulatory Visit | Attending: Orthopedic Surgery | Admitting: Orthopedic Surgery

## 2015-12-03 DIAGNOSIS — D492 Neoplasm of unspecified behavior of bone, soft tissue, and skin: Secondary | ICD-10-CM

## 2015-12-03 MED ORDER — GADOBENATE DIMEGLUMINE 529 MG/ML IV SOLN
14.0000 mL | Freq: Once | INTRAVENOUS | Status: AC | PRN
  Administered 2015-12-03: 14 mL via INTRAVENOUS

## 2015-12-13 ENCOUNTER — Other Ambulatory Visit: Payer: Self-pay | Admitting: Orthopedic Surgery

## 2015-12-29 ENCOUNTER — Encounter (HOSPITAL_BASED_OUTPATIENT_CLINIC_OR_DEPARTMENT_OTHER): Payer: Self-pay | Admitting: *Deleted

## 2016-01-05 ENCOUNTER — Ambulatory Visit (HOSPITAL_BASED_OUTPATIENT_CLINIC_OR_DEPARTMENT_OTHER): Payer: BLUE CROSS/BLUE SHIELD | Admitting: Anesthesiology

## 2016-01-05 ENCOUNTER — Encounter (HOSPITAL_BASED_OUTPATIENT_CLINIC_OR_DEPARTMENT_OTHER)
Admission: RE | Disposition: A | Discharge status: Home / Self Care | Payer: Self-pay | Source: Ambulatory Visit | Attending: Orthopedic Surgery

## 2016-01-05 ENCOUNTER — Ambulatory Visit (HOSPITAL_BASED_OUTPATIENT_CLINIC_OR_DEPARTMENT_OTHER)
Admission: RE | Admit: 2016-01-05 | Discharge: 2016-01-05 | Disposition: A | Discharge status: Home / Self Care | Payer: BLUE CROSS/BLUE SHIELD | Source: Ambulatory Visit | Attending: Orthopedic Surgery | Admitting: Orthopedic Surgery

## 2016-01-05 ENCOUNTER — Encounter (HOSPITAL_BASED_OUTPATIENT_CLINIC_OR_DEPARTMENT_OTHER): Payer: Self-pay | Admitting: Anesthesiology

## 2016-01-05 DIAGNOSIS — Z79899 Other long term (current) drug therapy: Secondary | ICD-10-CM | POA: Diagnosis not present

## 2016-01-05 DIAGNOSIS — Z7951 Long term (current) use of inhaled steroids: Secondary | ICD-10-CM | POA: Insufficient documentation

## 2016-01-05 DIAGNOSIS — Z87891 Personal history of nicotine dependence: Secondary | ICD-10-CM | POA: Insufficient documentation

## 2016-01-05 DIAGNOSIS — M25841 Other specified joint disorders, right hand: Secondary | ICD-10-CM | POA: Diagnosis not present

## 2016-01-05 DIAGNOSIS — Z7982 Long term (current) use of aspirin: Secondary | ICD-10-CM | POA: Diagnosis not present

## 2016-01-05 DIAGNOSIS — M65311 Trigger thumb, right thumb: Secondary | ICD-10-CM | POA: Diagnosis present

## 2016-01-05 DIAGNOSIS — I10 Essential (primary) hypertension: Secondary | ICD-10-CM | POA: Diagnosis not present

## 2016-01-05 HISTORY — PX: TRIGGER FINGER RELEASE: SHX641

## 2016-01-05 SURGERY — RELEASE, A1 PULLEY, FOR TRIGGER FINGER
Anesthesia: General | Site: Thumb | Laterality: Right

## 2016-01-05 MED ORDER — ONDANSETRON HCL 4 MG/2ML IJ SOLN
INTRAMUSCULAR | Status: AC
  Filled 2016-01-05: qty 2

## 2016-01-05 MED ORDER — DEXAMETHASONE SODIUM PHOSPHATE 10 MG/ML IJ SOLN
INTRAMUSCULAR | Status: DC | PRN
  Administered 2016-01-05: 10 mg via INTRAVENOUS

## 2016-01-05 MED ORDER — LIDOCAINE HCL (CARDIAC) 20 MG/ML IV SOLN
INTRAVENOUS | Status: AC
  Filled 2016-01-05: qty 5

## 2016-01-05 MED ORDER — GLYCOPYRROLATE 0.2 MG/ML IJ SOLN
0.2000 mg | Freq: Once | INTRAMUSCULAR | Status: AC | PRN
  Administered 2016-01-05: 0.2 mg via INTRAVENOUS

## 2016-01-05 MED ORDER — LACTATED RINGERS IV SOLN: INTRAVENOUS | Status: DC

## 2016-01-05 MED ORDER — MIDAZOLAM HCL 2 MG/2ML IJ SOLN
INTRAMUSCULAR | Status: AC
  Filled 2016-01-05: qty 2

## 2016-01-05 MED ORDER — EPHEDRINE SULFATE 50 MG/ML IJ SOLN
INTRAMUSCULAR | Status: DC | PRN
  Administered 2016-01-05: 10 mg via INTRAVENOUS

## 2016-01-05 MED ORDER — PROMETHAZINE HCL 25 MG/ML IJ SOLN: 6.2500 mg | INTRAMUSCULAR | Status: DC | PRN

## 2016-01-05 MED ORDER — LACTATED RINGERS IV SOLN
INTRAVENOUS | Status: DC
Start: 2016-01-05 — End: 2016-01-05
  Administered 2016-01-05 (×2): via INTRAVENOUS

## 2016-01-05 MED ORDER — GLYCOPYRROLATE 0.2 MG/ML IJ SOLN
INTRAMUSCULAR | Status: AC
  Filled 2016-01-05: qty 1

## 2016-01-05 MED ORDER — LIDOCAINE HCL (CARDIAC) 20 MG/ML IV SOLN
INTRAVENOUS | Status: DC | PRN
  Administered 2016-01-05: 60 mg via INTRAVENOUS

## 2016-01-05 MED ORDER — BUPIVACAINE HCL (PF) 0.25 % IJ SOLN
INTRAMUSCULAR | Status: DC | PRN
  Administered 2016-01-05: 10 mL

## 2016-01-05 MED ORDER — CHLORHEXIDINE GLUCONATE 4 % EX LIQD: 60.0000 mL | Freq: Once | CUTANEOUS | Status: DC

## 2016-01-05 MED ORDER — FENTANYL CITRATE (PF) 100 MCG/2ML IJ SOLN: 25.0000 ug | INTRAMUSCULAR | Status: DC | PRN

## 2016-01-05 MED ORDER — ONDANSETRON HCL 4 MG/2ML IJ SOLN
INTRAMUSCULAR | Status: DC | PRN
  Administered 2016-01-05: 4 mg via INTRAVENOUS

## 2016-01-05 MED ORDER — DEXAMETHASONE SODIUM PHOSPHATE 10 MG/ML IJ SOLN
INTRAMUSCULAR | Status: AC
  Filled 2016-01-05: qty 1

## 2016-01-05 MED ORDER — FENTANYL CITRATE (PF) 100 MCG/2ML IJ SOLN
INTRAMUSCULAR | Status: AC
  Filled 2016-01-05: qty 2

## 2016-01-05 MED ORDER — MIDAZOLAM HCL 2 MG/2ML IJ SOLN
1.0000 mg | INTRAMUSCULAR | Status: DC | PRN
  Administered 2016-01-05: 2 mg via INTRAVENOUS

## 2016-01-05 MED ORDER — 0.9 % SODIUM CHLORIDE (POUR BTL) OPTIME
TOPICAL | Status: DC | PRN
  Administered 2016-01-05: 200 mL

## 2016-01-05 MED ORDER — PROPOFOL 10 MG/ML IV BOLUS
INTRAVENOUS | Status: DC | PRN
  Administered 2016-01-05: 200 mg via INTRAVENOUS

## 2016-01-05 MED ORDER — HYDROCODONE-ACETAMINOPHEN 5-325 MG PO TABS: ORAL_TABLET | ORAL | Status: DC

## 2016-01-05 MED ORDER — EPHEDRINE SULFATE 50 MG/ML IJ SOLN
INTRAMUSCULAR | Status: AC
  Filled 2016-01-05: qty 1

## 2016-01-05 MED ORDER — CEFAZOLIN SODIUM-DEXTROSE 2-3 GM-% IV SOLR
INTRAVENOUS | Status: AC
  Filled 2016-01-05: qty 50

## 2016-01-05 MED ORDER — CEFAZOLIN SODIUM-DEXTROSE 2-3 GM-% IV SOLR
2.0000 g | INTRAVENOUS | Status: AC
  Administered 2016-01-05: 2 g via INTRAVENOUS

## 2016-01-05 MED ORDER — MEPERIDINE HCL 25 MG/ML IJ SOLN: 6.2500 mg | INTRAMUSCULAR | Status: DC | PRN

## 2016-01-05 MED ORDER — FENTANYL CITRATE (PF) 100 MCG/2ML IJ SOLN
50.0000 ug | INTRAMUSCULAR | Status: AC | PRN
  Administered 2016-01-05: 25 ug via INTRAVENOUS
  Administered 2016-01-05: 100 ug via INTRAVENOUS
  Administered 2016-01-05: 25 ug via INTRAVENOUS

## 2016-01-05 MED ORDER — SCOPOLAMINE 1 MG/3DAYS TD PT72: 1.0000 | MEDICATED_PATCH | Freq: Once | TRANSDERMAL | Status: DC | PRN

## 2016-01-05 SURGICAL SUPPLY — 38 items
BANDAGE COBAN STERILE 2 (GAUZE/BANDAGES/DRESSINGS) ×2 IMPLANT
BLADE MINI RND TIP GREEN BEAV (BLADE) ×2 IMPLANT
BLADE SURG 15 STRL LF DISP TIS (BLADE) ×2 IMPLANT
BLADE SURG 15 STRL SS (BLADE) ×2
BNDG CONFORM 2 STRL LF (GAUZE/BANDAGES/DRESSINGS) ×2 IMPLANT
BNDG ESMARK 4X9 LF (GAUZE/BANDAGES/DRESSINGS) ×2 IMPLANT
CHLORAPREP W/TINT 26ML (MISCELLANEOUS) ×2 IMPLANT
CORDS BIPOLAR (ELECTRODE) ×2 IMPLANT
COVER BACK TABLE 60X90IN (DRAPES) ×2 IMPLANT
COVER MAYO STAND STRL (DRAPES) ×2 IMPLANT
CUFF TOURNIQUET SINGLE 18IN (TOURNIQUET CUFF) ×2 IMPLANT
DRAPE EXTREMITY T 121X128X90 (DRAPE) ×2 IMPLANT
DRAPE SURG 17X23 STRL (DRAPES) ×2 IMPLANT
GAUZE SPONGE 4X4 12PLY STRL (GAUZE/BANDAGES/DRESSINGS) ×2 IMPLANT
GAUZE XEROFORM 1X8 LF (GAUZE/BANDAGES/DRESSINGS) ×2 IMPLANT
GLOVE BIO SURGEON STRL SZ7.5 (GLOVE) ×2 IMPLANT
GLOVE BIOGEL PI IND STRL 7.0 (GLOVE) ×2 IMPLANT
GLOVE BIOGEL PI IND STRL 8 (GLOVE) ×1 IMPLANT
GLOVE BIOGEL PI IND STRL 8.5 (GLOVE) ×1 IMPLANT
GLOVE BIOGEL PI INDICATOR 7.0 (GLOVE) ×2
GLOVE BIOGEL PI INDICATOR 8 (GLOVE) ×1
GLOVE BIOGEL PI INDICATOR 8.5 (GLOVE) ×1
GLOVE SURG ORTHO 8.0 STRL STRW (GLOVE) ×2 IMPLANT
GLOVE SURG SS PI 7.0 STRL IVOR (GLOVE) ×4 IMPLANT
GOWN STRL REUS W/ TWL LRG LVL3 (GOWN DISPOSABLE) ×2 IMPLANT
GOWN STRL REUS W/TWL LRG LVL3 (GOWN DISPOSABLE) ×2
GOWN STRL REUS W/TWL XL LVL3 (GOWN DISPOSABLE) ×4 IMPLANT
NEEDLE HYPO 25X1 1.5 SAFETY (NEEDLE) ×2 IMPLANT
NS IRRIG 1000ML POUR BTL (IV SOLUTION) ×2 IMPLANT
PACK BASIN DAY SURGERY FS (CUSTOM PROCEDURE TRAY) ×2 IMPLANT
PADDING CAST ABS 4INX4YD NS (CAST SUPPLIES) ×1
PADDING CAST ABS COTTON 4X4 ST (CAST SUPPLIES) ×1 IMPLANT
STOCKINETTE 4X48 STRL (DRAPES) ×2 IMPLANT
SUT ETHILON 4 0 PS 2 18 (SUTURE) ×2 IMPLANT
SYR BULB 3OZ (MISCELLANEOUS) ×2 IMPLANT
SYR CONTROL 10ML LL (SYRINGE) ×2 IMPLANT
TOWEL OR 17X24 6PK STRL BLUE (TOWEL DISPOSABLE) ×4 IMPLANT
UNDERPAD 30X30 (UNDERPADS AND DIAPERS) ×2 IMPLANT

## 2016-01-05 NOTE — Brief Op Note (Signed)
01/05/2016  11:46 AM  PATIENT:  Doyne Keel  55 y.o. male  PRE-OPERATIVE DIAGNOSIS:  RIGHT THUMB TRIGGER DIGIT and ENLARGED SESAMOID  POST-OPERATIVE DIAGNOSIS:  RIGHT THUMB TRIGGER DIGIT and ENLARGED SESAMOID  PROCEDURE:  Procedure(s): RELEASE TRIGGER FINGER/A-1 PULLEY RIGHT THUMB,RIGHT THUMB REMOVAL SESAMOID (Right)  SURGEON:  Surgeon(s) and Role:    * Leanora Cover, MD - Primary    * Daryll Brod, MD - Assisting  PHYSICIAN ASSISTANT:   ASSISTANTS: Daryll Brod, MD   ANESTHESIA:   general  EBL:  Total I/O In: 1000 [I.V.:1000] Out: -   BLOOD ADMINISTERED:none  DRAINS: none   LOCAL MEDICATIONS USED:  MARCAINE     SPECIMEN:  No Specimen  DISPOSITION OF SPECIMEN:  N/A  COUNTS:  YES  TOURNIQUET:   Total Tourniquet Time Documented: Upper Arm (Right) - 26 minutes Total: Upper Arm (Right) - 26 minutes   DICTATION: .Other Dictation: Dictation Number [id number]  PLAN OF CARE: Discharge to home after PACU  PATIENT DISPOSITION:  PACU - hemodynamically stable.

## 2016-01-05 NOTE — Op Note (Addendum)
NAME:  Derrick Villegas, Derrick Villegas                ACCOUNT NO.:  [id number]  MEDICAL RECORD NO.:  [id number]  LOCATION:                                 FACILITY:  PHYSICIAN:  Leanora Cover, MD             DATE OF BIRTH:  DATE OF PROCEDURE:  01/05/2016 DATE OF DISCHARGE:                              OPERATIVE REPORT   PREOPERATIVE DIAGNOSIS:  Right thumb trigger digit and interphalangeal joint sesamoid.  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  PROCEDURE:   1. Right thumb trigger release 2. Right thumb excision of sesamoid from interphalangeal joint through separate incision.  SURGEON:  Leanora Cover, MD  ASSISTANT:  Daryll Brod, M.D.  ANESTHESIA:  General.  IV FLUIDS:  Per anesthesia flow sheet.  ESTIMATED BLOOD LOSS:  Minimal.  COMPLICATIONS:  None.  SPECIMENS:  None.  TOURNIQUET TIME:  26 minutes.  DISPOSITION:  Stable to PACU.  INDICATIONS:  Derrick Villegas is a 55 year old male who was had pain and triggering of his right thumb.  He has had this injected twice without resolution.  He wishes to have a trigger release and excision of the IP joint sesamoid.  Risks, benefits and alternatives of the surgery were discussed including the risk of blood loss; infection; damage to nerves, vessels, tendons, ligaments, bone; failure of surgery; need for additional surgery; complications with wound healing; continued pain; recurrence of triggering.  He voiced understanding of these risks and elected to proceed.  OPERATIVE COURSE:  After being identified preoperatively by myself, the patient and I agreed upon the procedure and site of procedure.  Surgical site was marked.  The risks, benefits, and alternatives of the surgery were reviewed and he wished to proceed.  Surgical consent had been signed.  He was given IV Ancef as preoperative antibiotic prophylaxis. He was transferred to the operating room and placed  on the operating table in supine position with the right upper extremity on armboard. General anesthesia was induced by Anesthesiology.  Right upper extremity was prepped and draped in normal sterile orthopedic fashion.  A surgical pause was performed between the surgeons, anesthesia and operating room staff, and all were in agreement as to patient, procedure and site of procedure.  Tourniquet at the proximal aspect of the extremity was inflated to 250 mmHg after exsanguination of the limb with Esmarch bandage.  Incision was made at the proximal flexion crease of that thumb through the skin only.  This was carried into subcutaneous tissues by spreading technique.  The radial and ulnar digital nerves were identified and protected throughout the case.  The A1 pulley was identified and sharply incised.  Care was taken to protect the oblique pulley.  The tendon was brought through the wound and there was no recurrence of triggering.  An additional incision was made at the level of the IP joint.  This was a Erick Blinks type incision.  It was mid lateral at the distal phalanx and crossed onto the volar aspect of the proximal phalanx.  This was carried into subcutaneous tissues by spreading technique.  The ulnar digital nerve was identified.  It was trifurcating.  These were all protected.  The distal interlock pulley was incised for visualization.  The tendon was retracted.  The sesamoid was palpable within the volar plate.  The volar plate was sharply incised with the [facility] blade and the sesamoid carefully freed up from this.  The sesamoid was removed in its entirety.  It was noted to have a degenerative articular surface.  The wounds were copiously irrigated with sterile saline.  They were then closed with 4-0 nylon in horizontal mattress fashion.  A digital block was performed with 10 mL of 0.25% plain Marcaine to aid in postoperative analgesia.  The wounds were then dressed with sterile  Xeroform, 4x4s, and wrapped with a Kling and Coban dressing lightly.  Tourniquet was deflated at 26 minutes.  Fingertips were pink with brisk capillary refill after deflation of tourniquet. Operative drapes were broken down and the patient was awoken from anesthesia safely.  He was transferred back to stretcher and taken to PACU in stable condition.  I will see him back in the office 1 week for postoperative followup.  I will give him Norco 5/325, 1-2 p.o. q.6 hours p.r.n. pain, dispensed #30.     Leanora Cover, MD     KK/MEDQ  D:  01/05/2016  T:  01/05/2016  Job:  [id number]  Addendum (01/16/16): Post op diagnosis edited for clarity and accuracy.

## 2016-01-05 NOTE — Anesthesia Procedure Notes (Signed)
Procedure Name: LMA Insertion Date/Time: 01/05/2016 11:03 AM Performed by: Toula Moos L Pre-anesthesia Checklist: Patient identified, Emergency Drugs available, Suction available, Patient being monitored and Timeout performed Patient Re-evaluated:Patient Re-evaluated prior to inductionOxygen Delivery Method: Circle System Utilized Preoxygenation: Pre-oxygenation with 100% oxygen Intubation Type: IV induction Ventilation: Mask ventilation without difficulty LMA: LMA inserted LMA Size: 5.0 Number of attempts: 1 Airway Equipment and Method: Bite block Placement Confirmation: positive ETCO2 Tube secured with: Tape Dental Injury: Teeth and Oropharynx as per pre-operative assessment

## 2016-01-05 NOTE — Anesthesia Postprocedure Evaluation (Signed)
Anesthesia Post Note  Patient: Derrick Villegas  Procedure(s) Performed: Procedure(s) (LRB): RELEASE TRIGGER FINGER/A-1 PULLEY RIGHT THUMB,RIGHT THUMB REMOVAL SESAMOID (Right)  Patient location during evaluation: PACU Anesthesia Type: General Level of consciousness: awake and alert Pain management: pain level controlled Vital Signs Assessment: post-procedure vital signs reviewed and stable Respiratory status: spontaneous breathing, nonlabored ventilation, respiratory function stable and patient connected to nasal cannula oxygen Cardiovascular status: blood pressure returned to baseline and stable Postop Assessment: no signs of nausea or vomiting Anesthetic complications: no    Last Vitals:  Filed Vitals:   01/05/16 1215 01/05/16 1245  BP: 165/99 178/93  Pulse:  76  Temp:  36.4 C  Resp:  14    Last Pain: There were no vitals filed for this visit.               Montez Hageman

## 2016-01-05 NOTE — Anesthesia Preprocedure Evaluation (Signed)
Anesthesia Evaluation  Patient identified by MRN, date of birth, ID band Patient awake    Reviewed: Allergy & Precautions, NPO status , Patient's Chart, lab work & pertinent test results  Airway Mallampati: II  TM Distance: >3 FB Neck ROM: Full    Dental no notable dental hx.    Pulmonary neg pulmonary ROS, former smoker,    Pulmonary exam normal breath sounds clear to auscultation       Cardiovascular negative cardio ROS Normal cardiovascular exam Rhythm:Regular Rate:Normal     Neuro/Psych negative neurological ROS  negative psych ROS   GI/Hepatic negative GI ROS, Neg liver ROS,   Endo/Other  negative endocrine ROS  Renal/GU negative Renal ROS  negative genitourinary   Musculoskeletal negative musculoskeletal ROS (+)   Abdominal   Peds negative pediatric ROS (+)  Hematology negative hematology ROS (+)   Anesthesia Other Findings   Reproductive/Obstetrics negative OB ROS                            Anesthesia Physical Anesthesia Plan  ASA: II  Anesthesia Plan: General   Post-op Pain Management:    Induction: Intravenous  Airway Management Planned: LMA  Additional Equipment:   Intra-op Plan:   Post-operative Plan: Extubation in OR  Informed Consent: I have reviewed the patients History and Physical, chart, labs and discussed the procedure including the risks, benefits and alternatives for the proposed anesthesia with the patient or authorized representative who has indicated his/her understanding and acceptance.   Dental advisory given  Plan Discussed with: CRNA  Anesthesia Plan Comments:         Anesthesia Quick Evaluation  

## 2016-01-05 NOTE — Op Note (Signed)
I assisted Surgeon(s) and Role:    * Leanora Cover, MD - Primary    * Daryll Brod, MD - Assisting on the Procedure(s): RELEASE TRIGGER FINGER/A-1 PULLEY RIGHT [place] on 01/05/2016.  I provided assistance on this case as follows: preparation of the case, approach, retraction, identification of the neuovascular bundle, excision of the sesmoid bone and closure. I was present for the entire case.  Electronically signed by: Wynonia Sours, MD Date: 01/05/2016 Time: 2:25 PM

## 2016-01-05 NOTE — Op Note (Signed)
[id number] 

## 2016-01-05 NOTE — Discharge Instructions (Addendum)

## 2016-01-05 NOTE — H&P (Signed)
  Derrick Villegas is an 55 y.o. male.   Chief Complaint: right thumb trigger digit and ip joint sesamoid HPI: 55 yo with triggering right thumb and mass at ip joint.  The trigger has been injected twice without resolution.  The mass at the ip joint is bothersome to him.  He wishes to have a trigger release and excision of the ip joint sesamoid.    Allergies:  Allergies  Allergen Reactions  . Paroxetine     REACTION: nausea, tingling in arms    Past Medical History  Diagnosis Date  . ANXIETY 08/29/2007  . COLONIC POLYPS, HX OF 03/24/2010  . DEPRESSION 08/22/2007  . Inflamed seborrheic keratosis 04/11/2010    Past Surgical History  Procedure Laterality Date  . Coccyx removal    . Colonoscopy    . Polypectomy      Family History: Family History  Problem Relation Age of Onset  . Thyroid disease Mother   . Hypertension Father   . Kidney disease Father   . Pancreatic cancer Father   . Prostate cancer Father   . Hypertension Brother   . Cancer Brother     Colon cancer  . Colon cancer Brother 17  . Esophageal cancer Neg Hx   . Rectal cancer Neg Hx   . Stomach cancer Neg Hx     Social History:   reports that he quit smoking about 22 years ago. He has never used smokeless tobacco. He reports that he does not drink alcohol or use illicit drugs.  Medications: Medications Prior to Admission  Medication Sig Dispense Refill  . ALPRAZolam (XANAX) 0.5 MG tablet TAKE ONE TABLET BY MOUTH THREE TIMES DAILY 60 tablet 2  . aspirin 81 MG tablet Take 81 mg by mouth daily.    . Cholecalciferol (VITAMIN D3) 2000 UNITS TABS Take 2,000 Units by mouth daily.    . fluticasone (FLONASE) 50 MCG/ACT nasal spray INSTILL 2 SPRAYS EACH NOSTRIL DAILY 16 g 11  . Multiple Vitamin (MULTIVITAMIN) tablet Take 1 tablet by mouth daily.    . Omega-3 Fatty Acids (FISH OIL) 1000 MG CAPS Take 1 capsule by mouth daily.      No results found for this or any previous visit (from the past 48 hour(s)).  No results  found.   A comprehensive review of systems was negative.  Blood pressure 147/88, pulse 73, temperature 98.1 F (36.7 C), temperature source Oral, resp. rate 20, height 5' 9.5" (1.765 m), weight 72.122 kg (159 lb), SpO2 100 %.  General appearance: alert, cooperative and appears stated age Head: Normocephalic, without obvious abnormality, atraumatic Neck: supple, symmetrical, trachea midline Resp: clear to auscultation bilaterally Cardio: regular rate and rhythm GI: non-tender Extremities: intact sensation and capillary refill all digits.  +epl/fpl/io.  no wounds. Pulses: 2+ and symmetric Skin: Skin color, texture, turgor normal. No rashes or lesions Neurologic: Grossly normal Incision/Wound: none  Assessment/Plan Right thumb trigger and ip joint sesamoid.  Non operative and operative treatment options were discussed with the patient and patient wishes to proceed with operative treatment. Risks, benefits, and alternatives of surgery were discussed and the patient agrees with the plan of care.   Derrick Villegas R 01/05/2016, 10:31 AM

## 2016-01-05 NOTE — Transfer of Care (Signed)
Immediate Anesthesia Transfer of Care Note  Patient: Derrick Villegas  Procedure(s) Performed: Procedure(s): RELEASE TRIGGER FINGER/A-1 PULLEY RIGHT THUMB,RIGHT THUMB REMOVAL SESAMOID (Right)  Patient Location: PACU  Anesthesia Type:General  Level of Consciousness: sedated  Airway & Oxygen Therapy: Patient Spontanous Breathing and Patient connected to face mask oxygen  Post-op Assessment: Report given to RN and Post -op Vital signs reviewed and stable  Post vital signs: Reviewed and stable  Last Vitals:  Filed Vitals:   01/05/16 0951  BP: 147/88  Pulse: 73  Temp: 36.7 C  Resp: 20    Complications: No apparent anesthesia complications

## 2016-01-06 ENCOUNTER — Encounter (HOSPITAL_BASED_OUTPATIENT_CLINIC_OR_DEPARTMENT_OTHER): Payer: Self-pay | Admitting: Orthopedic Surgery

## 2016-01-24 ENCOUNTER — Other Ambulatory Visit: Payer: Self-pay | Admitting: Internal Medicine

## 2016-06-15 ENCOUNTER — Other Ambulatory Visit: Payer: Self-pay | Admitting: Internal Medicine

## 2016-06-15 NOTE — Telephone Encounter (Signed)
Okay to refill? 

## 2016-06-18 NOTE — Telephone Encounter (Signed)
Okay to refill? 

## 2016-06-19 NOTE — Telephone Encounter (Signed)
Rx called in 

## 2016-11-23 ENCOUNTER — Other Ambulatory Visit (INDEPENDENT_AMBULATORY_CARE_PROVIDER_SITE_OTHER): Payer: BLUE CROSS/BLUE SHIELD

## 2016-11-23 DIAGNOSIS — Z0000 Encounter for general adult medical examination without abnormal findings: Secondary | ICD-10-CM | POA: Diagnosis not present

## 2016-11-23 LAB — LIPID PANEL
Cholesterol: 257 mg/dL — ABNORMAL HIGH (ref 0–200)
HDL: 58.8 mg/dL (ref >39.00)
LDL CALC: 183 mg/dL — AB (ref 0–99)
NONHDL: 197.95
Total CHOL/HDL Ratio: 4
Triglycerides: 74 mg/dL (ref 0.0–149.0)
VLDL: 14.8 mg/dL (ref 0.0–40.0)

## 2016-11-23 LAB — CBC WITH DIFFERENTIAL/PLATELET
BASOS ABS: 0.1 10*3/uL (ref 0.0–0.1)
Basophils Relative: 0.5 % (ref 0.0–3.0)
EOS ABS: 0.3 10*3/uL (ref 0.0–0.7)
Eosinophils Relative: 3.2 % (ref 0.0–5.0)
HCT: 44.8 % (ref 39.0–52.0)
Hemoglobin: 15.1 g/dL (ref 13.0–17.0)
LYMPHS ABS: 2.9 10*3/uL (ref 0.7–4.0)
Lymphocytes Relative: 26.7 % (ref 12.0–46.0)
MCHC: 33.8 g/dL (ref 30.0–36.0)
MCV: 94.2 fl (ref 78.0–100.0)
MONOS PCT: 11.5 % (ref 3.0–12.0)
Monocytes Absolute: 1.2 10*3/uL — ABNORMAL HIGH (ref 0.1–1.0)
NEUTROS ABS: 6.3 10*3/uL (ref 1.4–7.7)
NEUTROS PCT: 58.1 % (ref 43.0–77.0)
PLATELETS: 332 10*3/uL (ref 150.0–400.0)
RBC: 4.75 Mil/uL (ref 4.22–5.81)
RDW: 14.6 % (ref 11.5–15.5)
WBC: 10.9 10*3/uL — ABNORMAL HIGH (ref 4.0–10.5)

## 2016-11-23 LAB — BASIC METABOLIC PANEL
BUN: 13 mg/dL (ref 6–23)
CALCIUM: 9.8 mg/dL (ref 8.4–10.5)
CO2: 31 meq/L (ref 19–32)
CREATININE: 1.17 mg/dL (ref 0.40–1.50)
Chloride: 102 mEq/L (ref 96–112)
GFR: 68.09 mL/min (ref >60.00)
GLUCOSE: 99 mg/dL (ref 70–99)
Potassium: 4.5 mEq/L (ref 3.5–5.1)
SODIUM: 141 meq/L (ref 135–145)

## 2016-11-23 LAB — HEPATIC FUNCTION PANEL
ALBUMIN: 4.6 g/dL (ref 3.5–5.2)
ALK PHOS: 85 U/L (ref 39–117)
ALT: 26 U/L (ref 0–53)
AST: 27 U/L (ref 0–37)
Bilirubin, Direct: 0.1 mg/dL (ref 0.0–0.3)
Total Bilirubin: 0.4 mg/dL (ref 0.2–1.2)
Total Protein: 7.1 g/dL (ref 6.0–8.3)

## 2016-11-23 LAB — POC URINALSYSI DIPSTICK (AUTOMATED)
Bilirubin, UA: NEGATIVE
GLUCOSE UA: NEGATIVE
Ketones, UA: NEGATIVE
Leukocytes, UA: NEGATIVE
Nitrite, UA: NEGATIVE
Protein, UA: NEGATIVE
SPEC GRAV UA: 1.015
UROBILINOGEN UA: 0.2
pH, UA: 6

## 2016-11-23 LAB — TSH: TSH: 2.89 u[IU]/mL (ref 0.35–4.50)

## 2016-11-23 LAB — PSA: PSA: 1.01 ng/mL (ref 0.10–4.00)

## 2016-11-30 ENCOUNTER — Encounter: Payer: Self-pay | Admitting: Internal Medicine

## 2016-11-30 ENCOUNTER — Ambulatory Visit (INDEPENDENT_AMBULATORY_CARE_PROVIDER_SITE_OTHER): Payer: BLUE CROSS/BLUE SHIELD | Admitting: Internal Medicine

## 2016-11-30 VITALS — BP 126/72 | HR 67 | Temp 98.8°F | Ht 69.5 in | Wt 154.8 lb

## 2016-11-30 DIAGNOSIS — Z0000 Encounter for general adult medical examination without abnormal findings: Secondary | ICD-10-CM

## 2016-11-30 MED ORDER — ALPRAZOLAM 0.5 MG PO TABS: 0.5000 mg | ORAL_TABLET | Freq: Three times a day (TID) | ORAL | 3 refills | Status: DC | PRN

## 2016-11-30 NOTE — Progress Notes (Signed)
Subjective:    Patient ID: Derrick Villegas, male    DOB: 05-Oct-1961, 56 y.o.   MRN: [id number]  HPI 56 year old patient who is seen today for a preventive health examination. He generally enjoys good health.  He does have a history of anxiety disorder as well as allergic rhinitis.  He remains on alprazolam, which she takes sparingly.  He usually takes 1 half tablet at bedtime and rarely needs this medication throughout the day.  His mother remains in poor health, was advanced dementia which has been a significant stressor. He has a history of colonic polyps and a family history of colon cancer.  Last colonoscopy was December 2015  Family history unchanged.  Father history of prostate and pancreatic cancer.  Mother with dementia and history of retinal melanoma Brother with colon cancer.  Paternal aunt with a history of lung cancer  Social history.  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  Past Medical History:  Diagnosis Date  . ANXIETY 08/29/2007  . COLONIC POLYPS, HX OF 03/24/2010  . DEPRESSION 08/22/2007  . Inflamed seborrheic keratosis 04/11/2010     Social History   Social History  . Marital status: Single    Spouse name: N/A  . Number of children: N/A  . Years of education: N/A   Occupational History  . Not on file.   Social History Main Topics  . Smoking status: Former Smoker    Quit date: 11/19/1993  . Smokeless tobacco: Never Used  . Alcohol use No  . Drug use: No  . Sexual activity: Not on file   Other Topics Concern  . Not on file   Social History Narrative  . No narrative on file    Past Surgical History:  Procedure Laterality Date  . COCCYX REMOVAL    . COLONOSCOPY    . POLYPECTOMY    . TRIGGER FINGER RELEASE Right 01/05/2016   Procedure: RELEASE TRIGGER FINGER/A-1 PULLEY RIGHT THUMB,RIGHT THUMB REMOVAL SESAMOID;  Surgeon: Leanora Cover, MD;  Location: [facility];  Service: Orthopedics;  Laterality:  Right;    Family History  Problem Relation Age of Onset  . Thyroid disease Mother   . Hypertension Father   . Kidney disease Father   . Pancreatic cancer Father   . Prostate cancer Father   . Hypertension Brother   . Cancer Brother     Colon cancer  . Colon cancer Brother 18  . Esophageal cancer Neg Hx   . Rectal cancer Neg Hx   . Stomach cancer Neg Hx     Allergies  Allergen Reactions  . Paroxetine     REACTION: nausea, tingling in arms    Current Outpatient Prescriptions on File Prior to Visit  Medication Sig Dispense Refill  . ALPRAZolam (XANAX) 0.5 MG tablet TAKE 1 TABLET BY MOUTH 3 TIMES DAILY AS NEEDED 60 tablet 3  . aspirin 81 MG tablet Take 81 mg by mouth daily.    . Cholecalciferol (VITAMIN D3) 2000 UNITS TABS Take 2,000 Units by mouth daily.    . fluticasone (FLONASE) 50 MCG/ACT nasal spray INSTILL 2 SPRAYS EACH NOSTRIL DAILY 16 g 11  . Multiple Vitamin (MULTIVITAMIN) tablet Take 1 tablet by mouth daily.    . Omega-3 Fatty Acids (FISH OIL) 1000 MG CAPS Take 1 capsule by mouth daily.     No current facility-administered medications on file prior to visit.     BP 126/72 (BP Location: Left Arm, Patient Position:  Sitting, Cuff Size: Normal)   Pulse 67   Temp 98.8 F (37.1 C) (Oral)   Ht 5' 9.5" (1.765 m)   Wt 154 lb 12.8 oz (70.2 kg)   SpO2 98%   BMI 22.53 kg/m      Review of Systems  Constitutional: Negative for appetite change, chills, fatigue and fever.  HENT: Negative for congestion, dental problem, ear pain, hearing loss, sore throat, tinnitus, trouble swallowing and voice change.   Eyes: Negative for pain, discharge and visual disturbance.  Respiratory: Negative for cough, chest tightness, wheezing and stridor.   Cardiovascular: Negative for chest pain, palpitations and leg swelling.  Gastrointestinal: Negative for abdominal distention, abdominal pain, blood in stool, constipation, diarrhea, nausea and vomiting.  Genitourinary: Negative for  difficulty urinating, discharge, flank pain, genital sores, hematuria and urgency.  Musculoskeletal: Negative for arthralgias, back pain, gait problem, joint swelling, myalgias and neck stiffness.  Skin: Negative for rash.  Neurological: Negative for dizziness, syncope, speech difficulty, weakness, numbness and headaches.  Hematological: Negative for adenopathy. Does not bruise/bleed easily.  Psychiatric/Behavioral: Positive for sleep disturbance. Negative for behavioral problems and dysphoric mood. The patient is nervous/anxious.        Objective:   Physical Exam  Constitutional: He appears well-developed and well-nourished.  Blood pressure 130/76  HENT:  Head: Normocephalic and atraumatic.  Right Ear: External ear normal.  Left Ear: External ear normal.  Nose: Nose normal.  Mouth/Throat: Oropharynx is clear and moist.  Eyes: Conjunctivae and EOM are normal. Pupils are equal, round, and reactive to light. No scleral icterus.  Neck: Normal range of motion. Neck supple. No JVD present. No thyromegaly present.  Cardiovascular: Regular rhythm, normal heart sounds and intact distal pulses.  Exam reveals no gallop and no friction rub.   No murmur heard. Pulmonary/Chest: Effort normal and breath sounds normal. He exhibits no tenderness.  Abdominal: Soft. Bowel sounds are normal. He exhibits no distension and no mass. There is no tenderness.  Genitourinary: Prostate normal and penis normal. Rectal exam shows guaiac negative stool.  Musculoskeletal: Normal range of motion. He exhibits no edema or tenderness.  Lymphadenopathy:    He has no cervical adenopathy.  Neurological: He is alert. He has normal reflexes. No cranial nerve deficit. Coordination normal.  Skin: Skin is warm and dry. No rash noted.  Psychiatric: He has a normal mood and affect. His behavior is normal.          Assessment & Plan:   Preventive health examination Situational stress/anxiety disorder.  Continue when  necessary Xanax  More rigorous exercise program encouraged Continue colonoscopies at five-year intervals  Patient plans to change PCPs due to recent move and more convenience   Tajuana Kniskern Pilar Plate

## 2016-11-30 NOTE — Patient Instructions (Signed)
It is important that you exercise regularly, at least 20 minutes 3 to 4 times per week.  If you develop chest pain or shortness of breath seek  medical attention.  Continue colonoscopies at five-year intervals  Continue annual clinical examinations

## 2016-11-30 NOTE — Progress Notes (Signed)
Pre visit review using our clinic review tool, if applicable. No additional management support is needed unless otherwise documented below in the visit note. 

## 2017-01-07 ENCOUNTER — Other Ambulatory Visit: Payer: Self-pay | Admitting: Internal Medicine

## 2017-02-26 IMAGING — MR MR [PERSON_NAME]*[PERSON_NAME]* WO/W CM
5 of 8 series · 22 of 40 positions shown · IV contrast (Multihance 14ml)
Comparison: None.

CLINICAL DATA: Enlarging painful mass near the IP joint of the
right thumb over the last year. Previous steroid injections.

EXAM:
MRI OF THE RIGHT FINGERS WITHOUT AND WITH CONTRAST
TECHNIQUE: Multiplanar, multisequence MR imaging of the right thumb was
performed both before and after administration of intravenous
contrast.
CONTRAST:  14mL MULTIHANCE GADOBENATE DIMEGLUMINE 529 MG/ML IV SOLN

[Series 6: T1 fat-sat · axial · right · 3.0mm · 0.12mm/px · z∈[-16,+58]mm · 5 of 26 slices shown (1 of 2)]
[im 1/26]
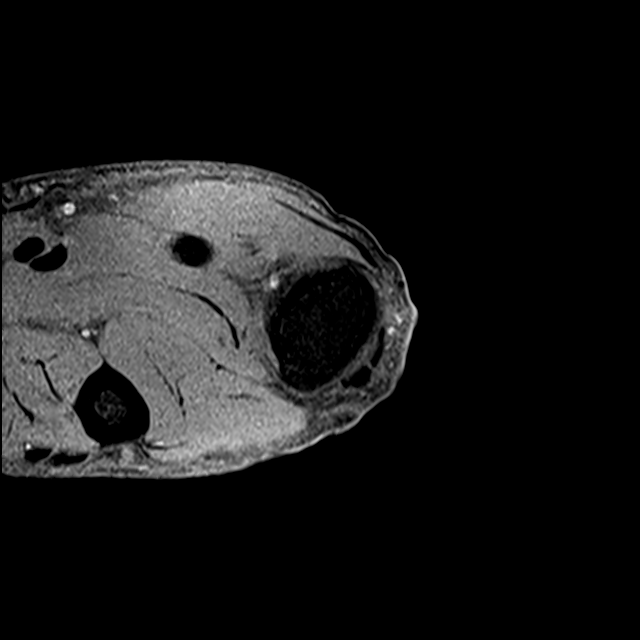
[im 7/26]
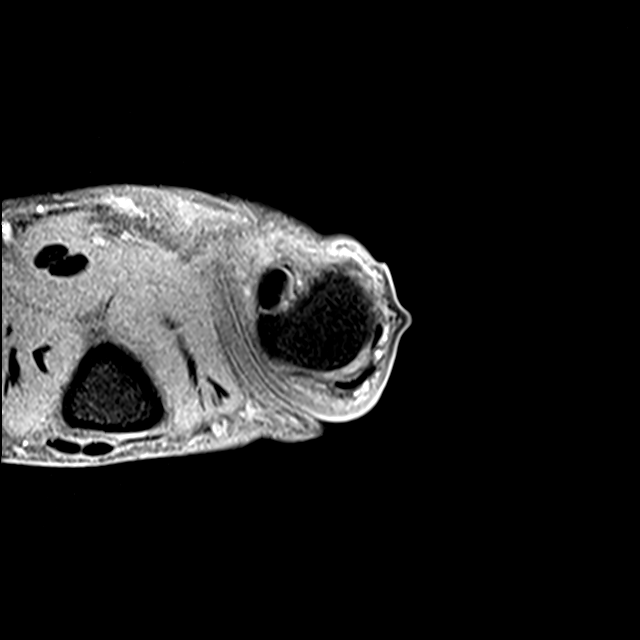
[im 13/26]
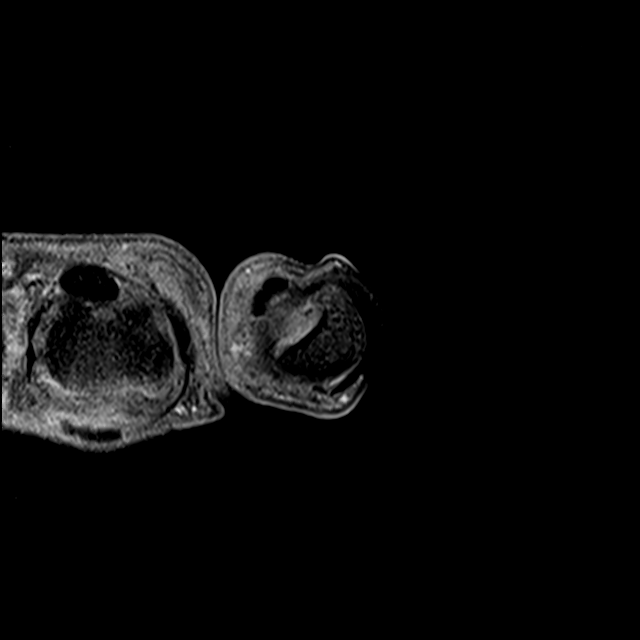
[im 19/26]
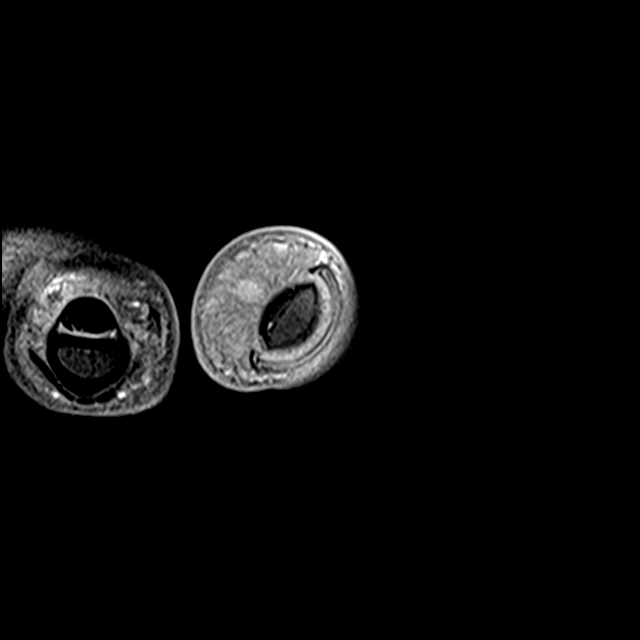
[im 26/26]
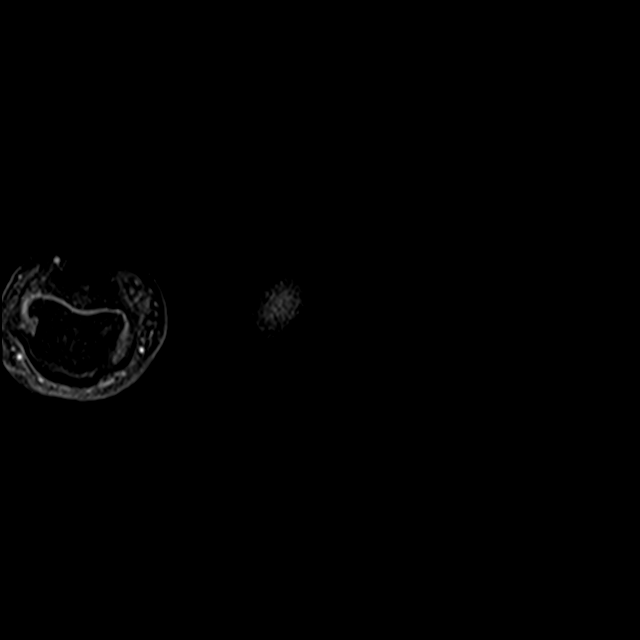

[Series 7: T2 fat-sat · sagittal · right · 1.0mm · 0.23mm/px · 6 of 28 slices shown (1 of 3)]
[im 1/28]
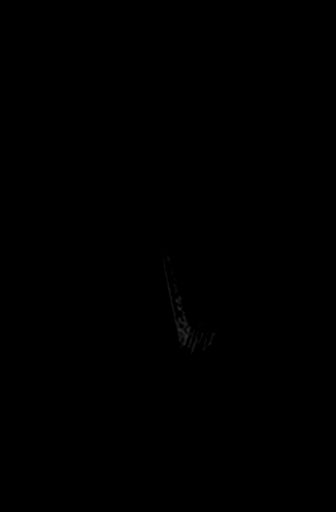
[im 6/28]
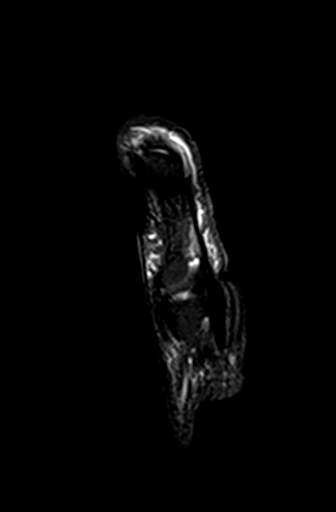
[im 11/28]
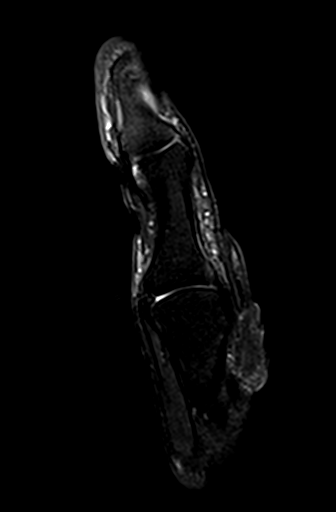
[im 17/28]
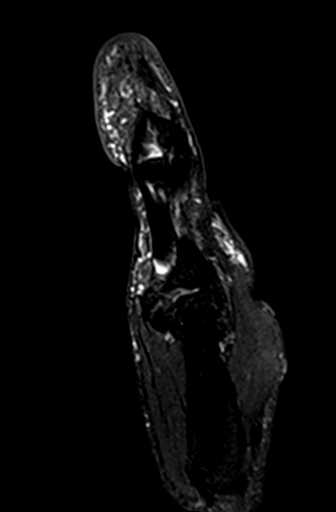
[im 22/28]
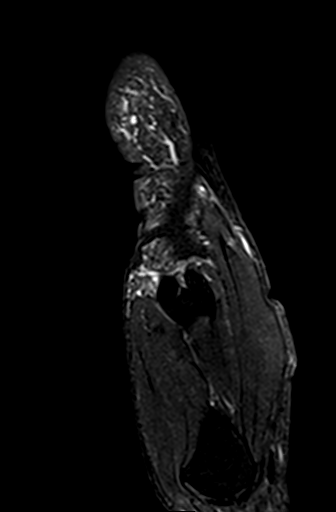
[im 28/28]
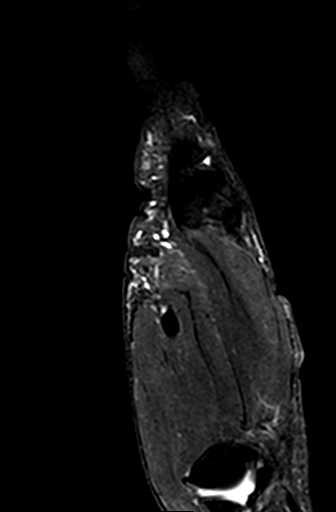

[Series 8: T2 fat-sat · axial · right · 3.0mm · 0.25mm/px · z∈[-16,+58]mm · 5 of 26 slices shown (2 of 3)]
[im 1/26]
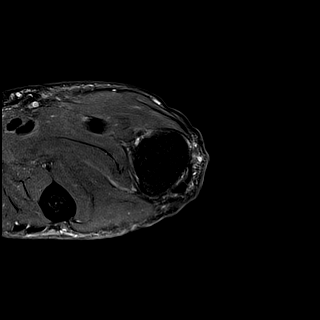
[im 7/26]
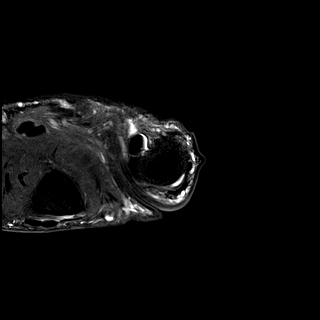
[im 13/26]
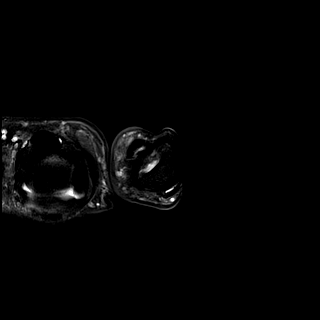
[im 19/26]
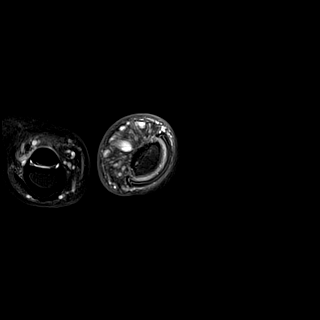
[im 26/26]
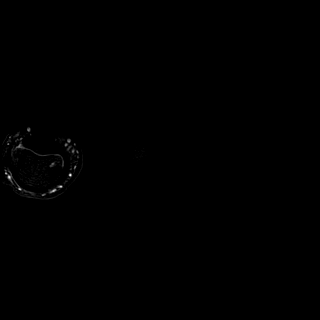

[Series 9: T2 fat-sat · coronal · right · 2.0mm · 0.20mm/px · 4 of 18 slices shown (3 of 3)]
[im 1/18]
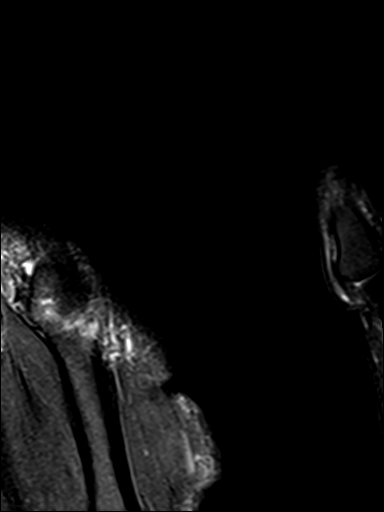
[im 6/18]
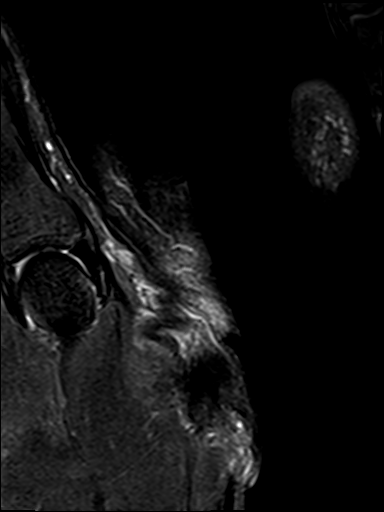
[im 12/18]
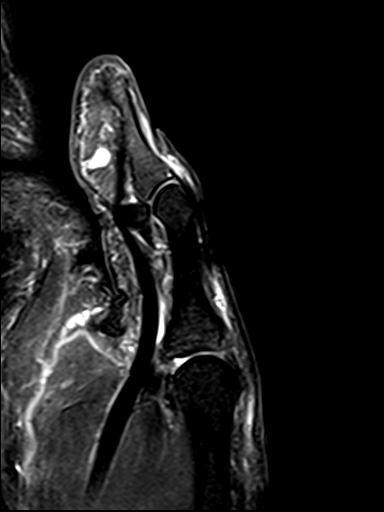
[im 18/18]
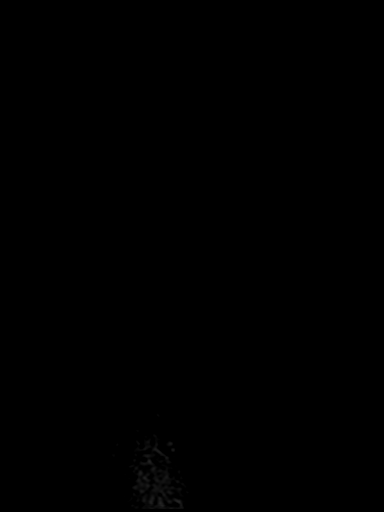

[Series 10: T1 fat-sat · axial · right · 3.0mm · 0.12mm/px · z∈[-16,+1]mm · 2 of 26 slices shown (2 of 2)]
[im 1/26]
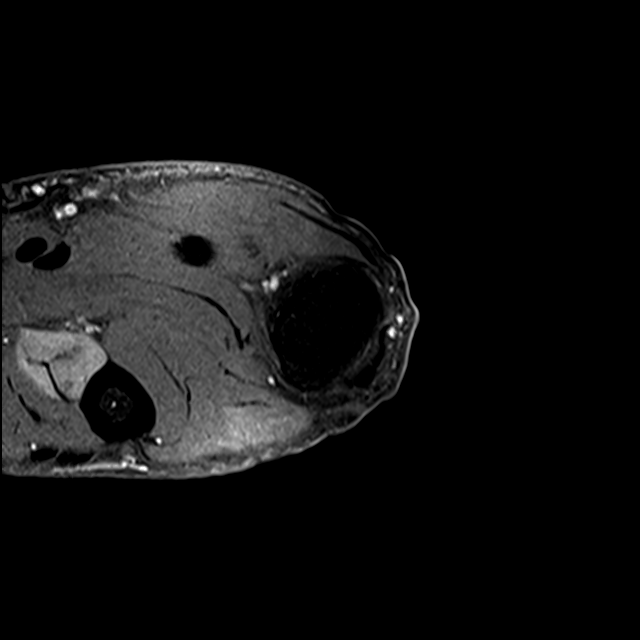
[im 7/26]
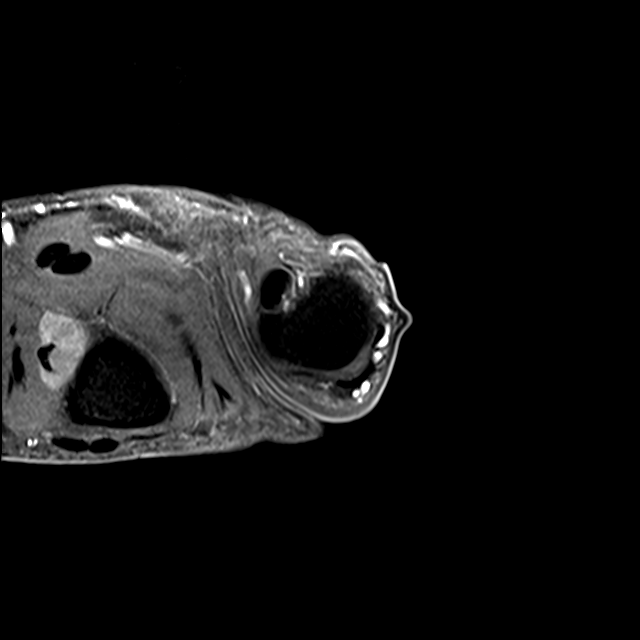

[22 of 40 positions shown; findings below may reference images not displayed]

FINDINGS: There is no evidence of mass or abnormal enhancement within the
right thumb. There is a possible 5 mm T2 hyperintense nodule within
the subcutaneous fat palmar to the distal phalanx, best seen on
sagittal image number 12 of series 9. This demonstrates no
enhancement following contrast and could reflect a small ganglion.
There is no osseous erosion. The nail bed appears unremarkable,
without evidence of glomus tumor.

The flexor and extensor pollicis tendons appear normal. There are
mild degenerative changes at the interphalangeal joint. There is a
prominent sesamoid of the IP joint. No significant joint effusion or
erosive change.

Asymmetric enhancement is noted within the first palmar intraosseous
muscle on the post-contrast axial images, of uncertain significance.
IMPRESSION: 1. Possible small ganglion within the subcutaneous fat palmar to the
distal phalanx of the right thumb. Correlate clinically.
2. No other evidence of right thumb mass or abnormal enhancement.
3. Enhancement within the first palmar interosseous muscle of
uncertain significance.

## 2019-10-19 ENCOUNTER — Encounter: Payer: Self-pay | Admitting: Gastroenterology

## 2020-11-19 HISTORY — PX: INGUINAL HERNIA REPAIR: SUR1180

## 2021-07-03 ENCOUNTER — Encounter: Payer: Self-pay | Admitting: Gastroenterology

## 2021-08-17 ENCOUNTER — Ambulatory Visit (AMBULATORY_SURGERY_CENTER): Payer: Self-pay

## 2021-08-17 ENCOUNTER — Encounter: Payer: Self-pay | Admitting: Gastroenterology

## 2021-08-17 ENCOUNTER — Other Ambulatory Visit: Payer: Self-pay

## 2021-08-17 VITALS — Ht 69.5 in | Wt 168.0 lb

## 2021-08-17 DIAGNOSIS — Z85038 Personal history of other malignant neoplasm of large intestine: Secondary | ICD-10-CM

## 2021-08-17 DIAGNOSIS — Z800 Family history of malignant neoplasm of digestive organs: Secondary | ICD-10-CM

## 2021-08-17 DIAGNOSIS — Z86010 Personal history of colonic polyps: Secondary | ICD-10-CM

## 2021-08-17 MED ORDER — PLENVU 140 G PO SOLR: 1.0000 | ORAL | 0 refills | Status: DC

## 2021-08-17 NOTE — Progress Notes (Signed)
No egg or soy allergy known to patient  No issues known to pt with past sedation with any surgeries or procedures Patient denies ever being told they had issues or difficulty with intubation  No FH of Malignant Hyperthermia Pt is not on diet pills Pt is not on  home 02  Pt is not on blood thinners  Pt denies issues with constipation-not at this time- on daily Miralax post  No A fib or A flutter  Pt is fully vaccinated for Covid x 2 + booster; Coupon given to pt in PV today, Code to Pharmacy and NO PA's for preps discussed with pt in PV today  Discussed with pt there will be an out-of-pocket cost for prep and that varies from $0 to 70 +  dollars  Due to the COVID-19 pandemic we are asking patients to follow certain guidelines.  Pt aware of COVID protocols and [facility] guidelines

## 2021-08-31 ENCOUNTER — Encounter: Payer: Self-pay | Admitting: Gastroenterology

## 2021-08-31 ENCOUNTER — Ambulatory Visit (AMBULATORY_SURGERY_CENTER): Payer: BLUE CROSS/BLUE SHIELD | Admitting: Gastroenterology

## 2021-08-31 ENCOUNTER — Other Ambulatory Visit: Payer: Self-pay

## 2021-08-31 VITALS — BP 152/99 | HR 82 | Temp 98.6°F | Resp 15 | Ht 69.5 in | Wt 168.0 lb

## 2021-08-31 DIAGNOSIS — K621 Rectal polyp: Secondary | ICD-10-CM | POA: Diagnosis not present

## 2021-08-31 DIAGNOSIS — D123 Benign neoplasm of transverse colon: Secondary | ICD-10-CM

## 2021-08-31 DIAGNOSIS — Z800 Family history of malignant neoplasm of digestive organs: Secondary | ICD-10-CM

## 2021-08-31 DIAGNOSIS — D128 Benign neoplasm of rectum: Secondary | ICD-10-CM

## 2021-08-31 DIAGNOSIS — Z86010 Personal history of colonic polyps: Secondary | ICD-10-CM

## 2021-08-31 MED ORDER — SODIUM CHLORIDE 0.9 % IV SOLN
500.0000 mL | Freq: Once | INTRAVENOUS | Status: DC
Start: 2021-08-31 — End: 2021-08-31

## 2021-08-31 NOTE — Progress Notes (Signed)
History & Physical  Primary Care Physician:  Larene Beach, MD Primary Gastroenterologist: Lucio Edward, MD  CHIEF COMPLAINT:  Personal history of colon polyps   HPI: Derrick Villegas is a 60 y.o. 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  He is status post inguinal hernia repair with mesh on July 20, 2021 and has made very good recovery.   Past Medical History:  Diagnosis Date   ANXIETY 08/29/2007   COLONIC POLYPS, HX OF 03/24/2010   DEPRESSION 08/22/2007   GERD (gastroesophageal reflux disease)    with certain foods/"not a reason for it"   Hyperlipidemia    on meds   Inflamed seborrheic keratosis 04/11/2010    Past Surgical History:  Procedure Laterality Date   COCCYX REMOVAL     COLONOSCOPY  10/2014   MS-MAC-movi(good)-tics/SSP x 1; 5 yr recall   INGUINAL HERNIA REPAIR Left 2022   POLYPECTOMY  10/2014   SSP x 1   TRIGGER FINGER RELEASE Right 01/05/2016   Procedure: RELEASE TRIGGER FINGER/A-1 PULLEY RIGHT THUMB,RIGHT THUMB REMOVAL SESAMOID;  Surgeon: Leanora Cover, MD;  Location: [facility];  Service: Orthopedics;  Laterality: Right;    Prior to Admission medications   Medication Sig Start Date End Date Taking? Authorizing Provider  aspirin 81 MG tablet Take 81 mg by mouth daily.   Yes [provider]  atorvastatin (LIPITOR) 10 MG tablet Take 10 mg by mouth daily. 06/05/21  Yes [provider]  Cholecalciferol (VITAMIN D3) 2000 UNITS TABS Take 1,000 Units by mouth daily.   Yes [provider]  fluticasone (FLONASE) 50 MCG/ACT nasal spray INSTILL 2 SPRAYS IN EACH NOSTRIL DAILY Patient taking differently: Place 1 spray into both nostrils daily as needed. 01/07/17  Yes Marletta Lor, MD  Multiple Vitamin (MULTIVITAMIN) tablet Take 1 tablet by mouth daily.   Yes [provider]  Omega-3 Fatty Acids (FISH OIL) 1200 MG CAPS Take 1 capsule by mouth daily.    Yes [provider]  polyethylene glycol powder (GLYCOLAX/MIRALAX) 17 GM/SCOOP powder Take by mouth daily at 6 (six) AM. 07/20/21   [provider]    Current Outpatient Medications  Medication Sig Dispense Refill   aspirin 81 MG tablet Take 81 mg by mouth daily.     atorvastatin (LIPITOR) 10 MG tablet Take 10 mg by mouth daily.     Cholecalciferol (VITAMIN D3) 2000 UNITS TABS Take 1,000 Units by mouth daily.     fluticasone (FLONASE) 50 MCG/ACT nasal spray INSTILL 2 SPRAYS IN EACH NOSTRIL DAILY (Patient taking differently: Place 1 spray into both nostrils daily as needed.) 16 g 0   Multiple Vitamin (MULTIVITAMIN) tablet Take 1 tablet by mouth daily.     Omega-3 Fatty Acids (FISH OIL) 1200 MG CAPS Take 1 capsule by mouth daily.     polyethylene glycol powder (GLYCOLAX/MIRALAX) 17 GM/SCOOP powder Take by mouth daily at 6 (six) AM.     Current Facility-Administered Medications  Medication Dose Route Frequency Provider Last Rate Last Admin   0.9 %  sodium chloride infusion  500 mL Intravenous Once Ladene Artist, MD        Allergies as of 08/31/2021 - Review Complete 08/31/2021  Allergen Reaction Noted   Hydrocodone-acetaminophen Other (See Comments) 08/16/2021   Paroxetine Nausea And Vomiting 08/22/2007    Family History  Problem Relation Age of Onset   Thyroid disease Mother    Hypertension Father    Kidney disease  Father    Pancreatic cancer Father    Prostate cancer Father    Colon cancer Brother 58   Colon polyps Brother 20   Hypertension Brother    Cancer Brother        Colon cancer   Esophageal cancer Neg Hx    Rectal cancer Neg Hx    Stomach cancer Neg Hx     Social History   Socioeconomic History   Marital status: Single    Spouse name: Not on file   Number of children: Not on file   Years of education: Not on file   Highest education level: Not on file  Occupational History   Not on file  Tobacco Use   Smoking status: Former    Types:  Cigarettes    Quit date: 11/19/1993    Years since quitting: 27.8   Smokeless tobacco: Never  Vaping Use   Vaping Use: Never used  Substance and Sexual Activity   Alcohol use: No    Alcohol/week: 0.0 standard drinks   Drug use: No   Sexual activity: Not on file  Other Topics Concern   Not on file  Social History Narrative   Not on file   Social Determinants of Health   Financial Resource Strain: Not on file  Food Insecurity: Not on file  Transportation Needs: Not on file  Physical Activity: Not on file  Stress: Not on file  Social Connections: Not on file  Intimate Partner Violence: Not on file    Review of Systems:  All systems reviewed an negative except where noted in HPI.  Gen: Denies any fever, chills, sweats, anorexia, fatigue, weakness, malaise, weight loss, and sleep disorder CV: Denies chest pain, angina, palpitations, syncope, orthopnea, PND, peripheral edema, and claudication. Resp: Denies dyspnea at rest, dyspnea with exercise, cough, sputum, wheezing, coughing up blood, and pleurisy. GI: Denies vomiting blood, jaundice, and fecal incontinence.   Denies dysphagia or odynophagia. GU : Denies urinary burning, blood in urine, urinary frequency, urinary hesitancy, nocturnal urination, and urinary incontinence. MS: Denies joint pain, limitation of movement, and swelling, stiffness, low back pain, extremity pain. Denies muscle weakness, cramps, atrophy.  Derm: Denies rash, itching, dry skin, hives, moles, warts, or unhealing ulcers.  Psych: Denies depression, anxiety, memory loss, suicidal ideation, hallucinations, paranoia, and confusion. Heme: Denies bruising, bleeding, and enlarged lymph nodes. Neuro:  Denies any headaches, dizziness, paresthesias. Endo:  Denies any problems with DM, thyroid, adrenal function.   Physical Exam: General:  Alert, well-developed, in NAD Head:  Normocephalic and atraumatic. Eyes:  Sclera clear, no icterus.   Conjunctiva pink. Ears:   Normal auditory acuity. Mouth:  No deformity or lesions.  Neck:  Supple; no masses . Lungs:  Clear throughout to auscultation.   No wheezes, crackles, or rhonchi. No acute distress. Heart:  Regular rate and rhythm; no murmurs. Abdomen:  Soft, nondistended, nontender. No masses, hepatomegaly. No obvious masses.  Normal bowel .    Rectal:  Deferred   Msk:  Symmetrical without gross deformities.. Pulses:  Normal pulses noted. Extremities:  Without edema. Neurologic:  Alert and  oriented x4;  grossly normal neurologically. Skin:  Intact without significant lesions or rashes. Cervical Nodes:  No significant cervical adenopathy. Psych:  Alert and cooperative. Normal mood and affect.   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     This patient is appropriate for endoscopic procedures in the ambulatory setting.  Pricilla Riffle. Fuller Plan  08/31/2021, 2:35 PM See Shea Evans, [facility], to contact our on call provider

## 2021-08-31 NOTE — Progress Notes (Signed)
Pt's states no medical or surgical changes since previsit or office visit. 

## 2021-08-31 NOTE — Op Note (Addendum)
[facility] Patient Name: Derrick Villegas Procedure Date: 08/31/2021 2:18 PM MRN: [id number] Endoscopist: Ladene Artist , MD Age: 60 Referring MD:  Date of Birth: 1961/06/04 Gender: Male Account #: [id number] Procedure:                Colonoscopy Indications:              High risk colon cancer surveillance: Personal                            history of sessile serrated colon polyp (less than                            10 mm in size) with no dysplasia and tubular                            adenomatous polyps. Family history of colon cancer,                            first degree relative. Medicines:                Monitored Anesthesia Care Procedure:                Pre-Anesthesia Assessment:                           - Prior to the procedure, a History and Physical                            was performed, and patient medications and                            allergies were reviewed. The patient's tolerance of                            previous anesthesia was also reviewed. The risks                            and benefits of the procedure and the sedation                            options and risks were discussed with the patient.                            All questions were answered, and informed consent                            was obtained. Prior Anticoagulants: The patient has                            taken no previous anticoagulant or antiplatelet                            agents. ASA Grade Assessment: II - A patient with  mild systemic disease. After reviewing the risks                            and benefits, the patient was deemed in                            satisfactory condition to undergo the procedure.                           After obtaining informed consent, the colonoscope                            was passed under direct vision. Throughout the                            procedure, the patient's blood pressure, pulse,  and                            oxygen saturations were monitored continuously. The                            CF [id number] #[id number] was introduced through the anus                            and advanced to the the cecum, identified by                            appendiceal orifice and ileocecal valve. The                            ileocecal valve, appendiceal orifice, and rectum                            were photographed. The quality of the bowel                            preparation was good. The colonoscopy was performed                            without difficulty. The patient tolerated the                            procedure well. Scope In: 2:38:00 PM Scope Out: 2:55:25 PM Scope Withdrawal Time: 0 hours 14 minutes 30 seconds  Total Procedure Duration: 0 hours 17 minutes 25 seconds  Findings:                 The perianal and digital rectal examinations were                            normal.                           Two sessile polyps were found in the rectum and  transverse colon. The polyps were 6 mm in size.                            These polyps were removed with a cold snare.                            Resection and retrieval were complete.                           Internal hemorrhoids were found during                            retroflexion. The hemorrhoids were small and Grade                            I (internal hemorrhoids that do not prolapse).                           The exam was otherwise without abnormality on                            direct and retroflexion views. Complications:            No immediate complications. Estimated blood loss:                            None. Estimated Blood Loss:     Estimated blood loss: none. Impression:               - Two 6 mm polyps in the rectum and in the                            transverse colon, removed with a cold snare.                            Resected and retrieved.                            - Internal hemorrhoids.                           - The examination was otherwise normal on direct                            and retroflexion views. Recommendation:           - Repeat colonoscopy iun 5 years after studies are                            complete for surveillance based on pathology                            results.                           - Patient has a contact number available for  emergencies. The signs and symptoms of potential                            delayed complications were discussed with the                            patient. Return to normal activities tomorrow.                            Written discharge instructions were provided to the                            patient.                           - Resume previous diet.                           - Continue present medications.                           - Await pathology results. Ladene Artist, MD 08/31/2021 2:58:55 PM This report has been signed electronically.

## 2021-08-31 NOTE — Progress Notes (Signed)
C.W. vital signs. 

## 2021-08-31 NOTE — Patient Instructions (Signed)
Handouts were given to your care partner on polyps and Hemorrhoids. You may resume your current medications today. Await biopsy results.  May take 1-3 weeks to receive pathology results. Please call if any questions or concerns.      YOU HAD AN ENDOSCOPIC PROCEDURE TODAY AT [facility]:   Refer to the procedure report that was given to you for any specific questions about what was found during the examination.  If the procedure report does not answer your questions, please call your gastroenterologist to clarify.  If you requested that your care partner not be given the details of your procedure findings, then the procedure report has been included in a sealed envelope for you to review at your convenience later.  YOU SHOULD EXPECT: Some feelings of bloating in the abdomen. Passage of more gas than usual.  Walking can help get rid of the air that was put into your GI tract during the procedure and reduce the bloating. If you had a lower endoscopy (such as a colonoscopy or flexible sigmoidoscopy) you may notice spotting of blood in your stool or on the toilet paper. If you underwent a bowel prep for your procedure, you may not have a normal bowel movement for a few days.  Please Note:  You might notice some irritation and congestion in your nose or some drainage.  This is from the oxygen used during your procedure.  There is no need for concern and it should clear up in a day or so.  SYMPTOMS TO REPORT IMMEDIATELY:  Following lower endoscopy (colonoscopy or flexible sigmoidoscopy):  Excessive amounts of blood in the stool  Significant tenderness or worsening of abdominal pains  Swelling of the abdomen that is new, acute  Fever of 100F or higher   For urgent or emergent issues, a gastroenterologist can be reached at any hour by calling [phone]. Do not use MyChart messaging for urgent concerns.    DIET:  We do recommend a small meal at first, but then you may proceed  to your regular diet.  Drink plenty of fluids but you should avoid alcoholic beverages for 24 hours.  ACTIVITY:  You should plan to take it easy for the rest of today and you should NOT DRIVE or use heavy machinery until tomorrow (because of the sedation medicines used during the test).    FOLLOW UP: Our staff will call the number listed on your records 48-72 hours following your procedure to check on you and address any questions or concerns that you may have regarding the information given to you following your procedure. If we do not reach you, we will leave a message.  We will attempt to reach you two times.  During this call, we will ask if you have developed any symptoms of COVID 19. If you develop any symptoms (ie: fever, flu-like symptoms, shortness of breath, cough etc.) before then, please call [phone].  If you test positive for Covid 19 in the 2 weeks post procedure, please call and report this information to [place].    If any biopsies were taken you will be contacted by phone or by letter within the next 1-3 weeks.  Please call us at [phone] if you have not heard about the biopsies in 3 weeks.    SIGNATURES/CONFIDENTIALITY: You and/or your care partner have signed paperwork which will be entered into your electronic medical record.  These signatures attest to the fact that that the information above on your After Visit  Summary has been reviewed and is understood.  Full responsibility of the confidentiality of this discharge information lies with you and/or your care-partner.  

## 2021-08-31 NOTE — Progress Notes (Signed)
Sedate, gd SR, tolerated procedure well, VSS, report to RN 

## 2021-08-31 NOTE — Progress Notes (Signed)
Pt is HIPPA .  Only went over findings and instructions with pt only.  AVS and handouts were placed in an envelope and sealed and given to pt on discharge.  No problems noted in the recovery room. maw

## 2021-08-31 NOTE — Progress Notes (Signed)
Called to room to assist during endoscopic procedure.  Patient ID and intended procedure confirmed with present staff. Received instructions for my participation in the procedure from the performing physician.  

## 2021-09-04 ENCOUNTER — Telehealth: Payer: Self-pay

## 2021-09-04 NOTE — Telephone Encounter (Signed)
  Follow up Call-  Call back number 08/31/2021  Post procedure Call Back phone  # [phone]  Permission to leave phone message Yes  Some recent data might be hidden     Patient questions:  Do you have a fever, pain , or abdominal swelling? No. Pain Score  0 *  Have you tolerated food without any problems? Yes.    Have you been able to return to your normal activities? Yes.    Do you have any questions about your discharge instructions: Diet   No. Medications  No. Follow up visit  No.  Do you have questions or concerns about your Care? No.  Actions: * If pain score is 4 or above: No action needed, pain <4. Have you developed a fever since your procedure? no  2.   Have you had an respiratory symptoms (SOB or cough) since your procedure? no  3.   Have you tested positive for COVID 19 since your procedure no  4.   Have you had any family members/close contacts diagnosed with the COVID 19 since your procedure?  no   If yes to any of these questions please route to Joylene John, RN and Joella Prince, RN

## 2021-09-12 ENCOUNTER — Encounter: Payer: Self-pay | Admitting: Gastroenterology
